# Patient Record
Sex: Male | Born: 1950 | Race: Asian | Hispanic: No | Marital: Married | Smoking: Never smoker
Health system: Southern US, Community
[De-identification: ages and names within clinical notes are randomized; demographics above are authoritative.]

## PROBLEM LIST (undated history)

## (undated) DIAGNOSIS — I10 Essential (primary) hypertension: Secondary | ICD-10-CM

## (undated) DIAGNOSIS — R519 Headache, unspecified: Secondary | ICD-10-CM

## (undated) DIAGNOSIS — E785 Hyperlipidemia, unspecified: Secondary | ICD-10-CM

## (undated) DIAGNOSIS — I2510 Atherosclerotic heart disease of native coronary artery without angina pectoris: Secondary | ICD-10-CM

## (undated) DIAGNOSIS — R7303 Prediabetes: Secondary | ICD-10-CM

## (undated) DIAGNOSIS — R51 Headache: Secondary | ICD-10-CM

## (undated) DIAGNOSIS — K219 Gastro-esophageal reflux disease without esophagitis: Secondary | ICD-10-CM

## (undated) HISTORY — DX: Prediabetes: R73.03

## (undated) HISTORY — DX: Atherosclerotic heart disease of native coronary artery without angina pectoris: I25.10

## (undated) HISTORY — DX: Essential (primary) hypertension: I10

## (undated) HISTORY — DX: Headache: R51

## (undated) HISTORY — DX: Gastro-esophageal reflux disease without esophagitis: K21.9

## (undated) HISTORY — DX: Headache, unspecified: R51.9

## (undated) HISTORY — DX: Hyperlipidemia, unspecified: E78.5

---

## 2001-01-30 ENCOUNTER — Encounter: Payer: Self-pay | Admitting: Family Medicine

## 2001-01-30 ENCOUNTER — Encounter: Admission: RE | Admit: 2001-01-30 | Discharge: 2001-01-30 | Payer: Self-pay | Admitting: Family Medicine

## 2003-01-26 ENCOUNTER — Encounter: Payer: Self-pay | Admitting: Family Medicine

## 2003-01-26 ENCOUNTER — Encounter: Admission: RE | Admit: 2003-01-26 | Discharge: 2003-01-26 | Payer: Self-pay | Admitting: Family Medicine

## 2003-07-15 ENCOUNTER — Encounter: Admission: RE | Admit: 2003-07-15 | Discharge: 2003-07-15 | Payer: Self-pay | Admitting: Family Medicine

## 2003-07-15 ENCOUNTER — Encounter: Payer: Self-pay | Admitting: Family Medicine

## 2003-08-06 ENCOUNTER — Encounter: Payer: Self-pay | Admitting: Family Medicine

## 2003-08-06 ENCOUNTER — Encounter: Admission: RE | Admit: 2003-08-06 | Discharge: 2003-08-06 | Payer: Self-pay | Admitting: Family Medicine

## 2003-12-09 ENCOUNTER — Encounter: Admission: RE | Admit: 2003-12-09 | Discharge: 2003-12-09 | Payer: Self-pay | Admitting: Family Medicine

## 2005-09-22 ENCOUNTER — Encounter: Admission: RE | Admit: 2005-09-22 | Discharge: 2005-09-22 | Payer: Self-pay | Admitting: Family Medicine

## 2006-07-02 ENCOUNTER — Encounter: Admission: RE | Admit: 2006-07-02 | Discharge: 2006-07-02 | Payer: Self-pay | Admitting: Family Medicine

## 2008-12-21 ENCOUNTER — Encounter: Admission: RE | Admit: 2008-12-21 | Discharge: 2008-12-21 | Payer: Self-pay | Admitting: Family Medicine

## 2009-01-01 ENCOUNTER — Encounter: Admission: RE | Admit: 2009-01-01 | Discharge: 2009-01-01 | Payer: Self-pay | Admitting: Family Medicine

## 2010-07-08 ENCOUNTER — Encounter: Admission: RE | Admit: 2010-07-08 | Discharge: 2010-07-08 | Payer: Self-pay | Admitting: Family Medicine

## 2014-09-23 ENCOUNTER — Institutional Professional Consult (permissible substitution): Payer: Self-pay | Admitting: Interventional Cardiology

## 2014-10-01 ENCOUNTER — Encounter: Payer: Self-pay | Admitting: Interventional Cardiology

## 2014-10-28 ENCOUNTER — Other Ambulatory Visit: Payer: Self-pay | Admitting: Gastroenterology

## 2014-10-28 ENCOUNTER — Encounter: Payer: Self-pay | Admitting: Cardiology

## 2014-10-28 DIAGNOSIS — R110 Nausea: Secondary | ICD-10-CM

## 2014-10-28 DIAGNOSIS — R1011 Right upper quadrant pain: Secondary | ICD-10-CM

## 2014-11-03 ENCOUNTER — Encounter (HOSPITAL_COMMUNITY)
Admission: RE | Admit: 2014-11-03 | Discharge: 2014-11-03 | Disposition: A | Discharge status: Home / Self Care | Payer: BLUE CROSS/BLUE SHIELD | Source: Ambulatory Visit | Attending: Gastroenterology | Admitting: Gastroenterology

## 2014-11-03 ENCOUNTER — Ambulatory Visit (HOSPITAL_COMMUNITY)
Admission: RE | Admit: 2014-11-03 | Discharge: 2014-11-03 | Disposition: A | Discharge status: Home / Self Care | Payer: BLUE CROSS/BLUE SHIELD | Source: Ambulatory Visit | Attending: Gastroenterology | Admitting: Gastroenterology

## 2014-11-03 DIAGNOSIS — R109 Unspecified abdominal pain: Secondary | ICD-10-CM | POA: Insufficient documentation

## 2014-11-03 DIAGNOSIS — R1011 Right upper quadrant pain: Secondary | ICD-10-CM

## 2014-11-03 DIAGNOSIS — R110 Nausea: Secondary | ICD-10-CM

## 2014-11-03 MED ORDER — TECHNETIUM TC 99M MEBROFENIN IV KIT
5.5000 | PACK | Freq: Once | INTRAVENOUS | Status: AC | PRN
  Administered 2014-11-03: 6 via INTRAVENOUS

## 2014-11-03 MED ORDER — SINCALIDE 5 MCG IJ SOLR
0.0200 ug/kg | Freq: Once | INTRAMUSCULAR | Status: AC
  Administered 2014-11-03: 1.3 ug via INTRAVENOUS

## 2015-01-13 ENCOUNTER — Other Ambulatory Visit: Payer: Self-pay | Admitting: Family Medicine

## 2015-01-13 ENCOUNTER — Ambulatory Visit
Admission: RE | Admit: 2015-01-13 | Discharge: 2015-01-13 | Disposition: A | Discharge status: Home / Self Care | Payer: Worker's Compensation | Source: Ambulatory Visit | Attending: Family Medicine | Admitting: Family Medicine

## 2015-01-13 DIAGNOSIS — W19XXXA Unspecified fall, initial encounter: Secondary | ICD-10-CM

## 2015-03-19 ENCOUNTER — Other Ambulatory Visit: Payer: Self-pay | Admitting: Family Medicine

## 2015-03-19 DIAGNOSIS — R51 Headache: Principal | ICD-10-CM

## 2015-03-19 DIAGNOSIS — R519 Headache, unspecified: Secondary | ICD-10-CM

## 2015-03-24 ENCOUNTER — Ambulatory Visit
Admission: RE | Admit: 2015-03-24 | Discharge: 2015-03-24 | Disposition: A | Discharge status: Home / Self Care | Payer: BLUE CROSS/BLUE SHIELD | Source: Ambulatory Visit | Attending: Family Medicine | Admitting: Family Medicine

## 2015-03-24 DIAGNOSIS — R519 Headache, unspecified: Secondary | ICD-10-CM

## 2015-03-24 DIAGNOSIS — R51 Headache: Principal | ICD-10-CM

## 2015-03-25 ENCOUNTER — Ambulatory Visit: Admission: RE | Admit: 2015-03-25 | Payer: BLUE CROSS/BLUE SHIELD | Source: Ambulatory Visit

## 2015-03-25 ENCOUNTER — Other Ambulatory Visit: Payer: Self-pay | Admitting: Family Medicine

## 2015-03-25 DIAGNOSIS — R51 Headache: Principal | ICD-10-CM

## 2015-03-25 DIAGNOSIS — R519 Headache, unspecified: Secondary | ICD-10-CM

## 2015-03-29 ENCOUNTER — Other Ambulatory Visit: Payer: Self-pay

## 2015-04-10 ENCOUNTER — Ambulatory Visit
Admission: RE | Admit: 2015-04-10 | Discharge: 2015-04-10 | Disposition: A | Discharge status: Home / Self Care | Payer: BLUE CROSS/BLUE SHIELD | Source: Ambulatory Visit | Attending: Family Medicine | Admitting: Family Medicine

## 2015-04-10 ENCOUNTER — Other Ambulatory Visit: Payer: Self-pay

## 2015-04-10 DIAGNOSIS — R519 Headache, unspecified: Secondary | ICD-10-CM

## 2015-04-10 DIAGNOSIS — R51 Headache: Principal | ICD-10-CM

## 2015-04-10 MED ORDER — GADOBENATE DIMEGLUMINE 529 MG/ML IV SOLN
13.0000 mL | Freq: Once | INTRAVENOUS | Status: AC | PRN
  Administered 2015-04-10: 13 mL via INTRAVENOUS

## 2015-06-04 ENCOUNTER — Other Ambulatory Visit: Payer: Self-pay | Admitting: Allergy

## 2015-06-04 ENCOUNTER — Ambulatory Visit
Admission: RE | Admit: 2015-06-04 | Discharge: 2015-06-04 | Disposition: A | Discharge status: Home / Self Care | Payer: BLUE CROSS/BLUE SHIELD | Source: Ambulatory Visit | Attending: Allergy | Admitting: Allergy

## 2015-06-04 DIAGNOSIS — J4520 Mild intermittent asthma, uncomplicated: Secondary | ICD-10-CM

## 2015-07-23 ENCOUNTER — Encounter: Payer: Self-pay | Admitting: *Deleted

## 2015-07-26 ENCOUNTER — Ambulatory Visit (INDEPENDENT_AMBULATORY_CARE_PROVIDER_SITE_OTHER): Payer: BLUE CROSS/BLUE SHIELD | Admitting: Diagnostic Neuroimaging

## 2015-07-26 ENCOUNTER — Encounter: Payer: Self-pay | Admitting: Diagnostic Neuroimaging

## 2015-07-26 ENCOUNTER — Encounter: Payer: Self-pay | Admitting: *Deleted

## 2015-07-26 VITALS — BP 154/90 | HR 78 | Ht 64.0 in | Wt 138.4 lb

## 2015-07-26 DIAGNOSIS — G43009 Migraine without aura, not intractable, without status migrainosus: Secondary | ICD-10-CM | POA: Diagnosis not present

## 2015-07-26 DIAGNOSIS — G4700 Insomnia, unspecified: Secondary | ICD-10-CM

## 2015-07-26 DIAGNOSIS — G4719 Other hypersomnia: Secondary | ICD-10-CM | POA: Diagnosis not present

## 2015-07-26 DIAGNOSIS — R0683 Snoring: Secondary | ICD-10-CM | POA: Diagnosis not present

## 2015-07-26 DIAGNOSIS — R42 Dizziness and giddiness: Secondary | ICD-10-CM

## 2015-07-26 MED ORDER — TOPIRAMATE 25 MG PO TABS: 25.0000 mg | ORAL_TABLET | Freq: Two times a day (BID) | ORAL | Status: DC

## 2015-07-26 NOTE — Patient Instructions (Addendum)
Thank you for coming to see [place] at [facility]. I hope we have been able to provide you high quality care today.  You may receive a patient satisfaction survey over the next few weeks. We would appreciate your feedback and comments so that we may continue to improve ourselves and the health of our patients.  - I will check MRI brain  - I will setup sleep study referral - try topiramate 57m at bedtime; after 1-2 weeks, may increase to 586mat bedtime; after 1-2 weeks may increase to 5064mwice a day - monitor blood pressure at home (record morning, resting BP)  - will recommend to be excused from work through September 07, 2015 due to this dizziness and headache   ~~~~~~~~~~~~~~~~~~~~~~~~~~~~~~~~~~~~~~~~~~~~~~~~~~~~~~~~~~~~~~~~~  DR. PENUMALLI'S GUIDE TO HAPPY AND HEALTHY LIVING These are some of my general health and wellness recommendations. Some of them may apply to you better than others. Please use common sense as you try these suggestions and feel free to ask me any questions.   ACTIVITY/FITNESS Mental, social, emotional and physical stimulation are very important for brain and body health. Try learning a new activity (arts, music, language, sports, games).  Keep moving your body to the best of your abilities. You can do this at home, inside or outside, the park, community center, gym or anywhere you like. Consider a physical therapist or personal trainer to get started. Consider the app Sworkit. Fitness trackers such as smart-watches, smart-phones or Fitbits can help as well.   NUTRITION Eat more plants: colorful vegetables, nuts, seeds and berries.  Eat less sugar, salt, preservatives and processed foods.  Avoid toxins such as cigarettes and alcohol.  Drink water when you are thirsty. Warm water with a slice of lemon is an excellent morning drink to start the day.  Consider these websites for more information The Nutrition Source  (htthttps://www.henry-hernandez.biz/recision Nutrition (wwwWindowBlog.ch RELAXATION Consider practicing mindfulness meditation or other relaxation techniques such as deep breathing, prayer, yoga, tai chi, massage. See website mindful.org or the apps Headspace or Calm to help get started.   SLEEP Try to get at least 7-8+ hours sleep per day. Regular exercise and reduced caffeine will help you sleep better. Practice good sleep hygeine techniques. See website sleep.org for more information.   PLANNING Prepare estate planning, living will, healthcare POA documents. Sometimes this is best planned with the help of an attorney. Theconversationproject.org and agingwithdignity.org are excellent resources.

## 2015-07-26 NOTE — Progress Notes (Signed)
[facility]  PATIENT: Aaron David DOB: 08-20-1951  REFERRING CLINICIAN: Maxwell HISTORY FROM: patient and wife  REASON FOR VISIT: new consult    HISTORICAL  CHIEF COMPLAINT:  Chief Complaint  Patient presents with  . Migraine    rm 7 New Patient, wife- [name]  . Vertigo    HISTORY OF PRESENT ILLNESS:   64 year old right-handed male with borderline diabetes, hypertension, here for evaluation of headaches and dizziness.  In March 2016 patient fell down when he didn't realize that a chair had been moved out of the way, fell down onto his bottom and developed low back pain. No head pain or dizziness at that time.  In June 2016 patient developed onset of generalized headache. Patient had MRI/MRA of the head which showed no acute findings.  In July 2016 patient had onset of right-sided ringing in the ear, fullness in the right ear, increasing headaches, drums sound in the right ear. This was associated with nausea and vomiting, phonophobia. No photophobia.  Since that time symptoms have slightly eased up but not gone away. He continues to have daily symptoms which fluctuate. No specific triggering factors. He went to see ENT who performed ENG testing raising possibility of CNS dysfunction. No peripheral vestibulopathy was found.  Patient denies history of migraine headaches or significant headaches earlier in life. No family history of migraines. His aunt had some headaches but these were not specifically diagnosed.  Patient does endorse some problems with snoring, insomnia, anxiety symptoms especially since the past 3 months. Also with significant daytime fatigue.   REVIEW OF SYSTEMS: Full 14 system review of systems performed and notable only for fatigue ringing in ears spinning sensation allergies headache insomnia snoring weakness dizziness.   ALLERGIES: Allergies  Allergen Reactions  . Penicillins Rash    itching    HOME MEDICATIONS: No  outpatient prescriptions prior to visit.   No facility-administered medications prior to visit.    PAST MEDICAL HISTORY: Past Medical History  Diagnosis Date  . Acid reflux   . Borderline diabetes   . Headache     PAST SURGICAL HISTORY: History reviewed. No pertinent past surgical history.  FAMILY HISTORY: Family History  Problem Relation Age of Onset  . Diabetes Mother   . Heart disease Mother   . Asthma Father     SOCIAL HISTORY:  Social History   Social History  . Marital Status: Married    Spouse Name: [name]  . Number of Children: 2  . Years of Education: 16   Occupational History  .      [employer]   Social History Main Topics  . Smoking status: Never Smoker   . Smokeless tobacco: Not on file  . Alcohol Use: No  . Drug Use: No  . Sexual Activity: Not on file   Other Topics Concern  . Not on file   Social History Narrative   Lives at home with wife, [name]   Caffeine use- coffee, tea 1 cup daily     PHYSICAL EXAM  GENERAL EXAM/CONSTITUTIONAL: Vitals:  Filed Vitals:   07/26/15 0828 07/26/15 0859 07/26/15 0900  BP: 140/85 140/84 154/90  Pulse: 80 76 78  Height: 5\' 4"  (1.626 m)    Weight: 138 lb 6.4 oz (62.778 kg)       Body mass index is 23.74 kg/(m^2).  Visual Acuity Screening   Right eye Left eye Both eyes  Without correction:     With correction: 20/30 20/30  Patient is in no distress; well developed, nourished and groomed; neck is supple  CARDIOVASCULAR:  Examination of carotid arteries is normal; no carotid bruits  Regular rate and rhythm, no murmurs  Examination of peripheral vascular system by observation and palpation is normal  EYES:  Ophthalmoscopic exam of optic discs and posterior segments is normal; no papilledema or hemorrhages  MUSCULOSKELETAL:  Gait, strength, tone, movements noted in Neurologic exam below  NEUROLOGIC: MENTAL STATUS:  No flowsheet data found.  awake, alert, oriented to person,  place and time  recent and remote memory intact  normal attention and concentration  language fluent, comprehension intact, naming intact,   fund of knowledge appropriate  CRANIAL NERVE:   2nd - no papilledema on fundoscopic exam  2nd, 3rd, 4th, 6th - pupils equal and reactive to light, visual fields full to confrontation, extraocular muscles intact, no nystagmus  5th - facial sensation symmetric  7th - facial strength symmetric  8th - hearing intact  9th - palate elevates symmetrically, uvula midline  11th - shoulder shrug symmetric  12th - tongue protrusion midline  MOTOR:   normal bulk and tone, full strength in the BUE, BLE  SENSORY:   normal and symmetric to light touch, pinprick, temperature, vibration  COORDINATION:   finger-nose-finger, fine finger movements normal  REFLEXES:   deep tendon reflexes TRACE and symmetric  GAIT/STATION:   narrow based gait; able to walk tandem; romberg is negative    DIAGNOSTIC DATA (LABS, IMAGING, TESTING) - I reviewed patient records, labs, notes, testing and imaging myself where available.  No results found for: WBC, HGB, HCT, MCV, PLT No results found for: NA, K, CL, CO2, GLUCOSE, BUN, CREATININE, CALCIUM, PROT, ALBUMIN, AST, ALT, ALKPHOS, BILITOT, GFRNONAA, GFRAA No results found for: CHOL, HDL, LDLCALC, LDLDIRECT, TRIG, CHOLHDL No results found for: HGBA1C No results found for: VITAMINB12 No results found for: TSH  04/10/15 MRI brain [I reviewed images myself and agree with interpretation. -VRP]  - No acute intracranial abnormality. Mild chronic white matter changes likely related to microvascular ischemia - Chronic microhemorrhage right cerebellum, question hypertension  04/10/15 MRA head [I reviewed images myself and agree with interpretation. -VRP]  - Negative    ASSESSMENT AND PLAN  64 y.o. year old male here with new onset headaches in June 2016, now with intermittent vertigo, dizziness, nausea,  right-sided throbbing headaches since July 2016. Patient had MRI study in June 2016 which showed no acute findings, but this was before onset of vertigo and nausea symptoms in July 2016. Therefore will repeat MRI scan to make sure no additional acute insult has occurred.   Migraine phenomenon is a possibility, but less likely given the fact the patient has never had migraine symptoms earlier in life and would be unusual for patient to start with migraine symptoms age 53 years old. Also will set up sleep study to rule out sleep apnea. We'll try empiric trial of topiramate.  Ddx: stroke vs migraine phenomenon  Dizziness and giddiness - Plan: MR Brain/IAC Wo/W Cm, Ambulatory referral to Sleep Studies  Migraine without aura and without status migrainosus, not intractable - Plan: MR Brain/IAC Wo/W Cm, Ambulatory referral to Sleep Studies  Snoring - Plan: MR Brain/IAC Wo/W Cm, Ambulatory referral to Sleep Studies  Excessive daytime sleepiness - Plan: MR Brain/IAC Wo/W Cm, Ambulatory referral to Sleep Studies  Insomnia - Plan: MR Brain/IAC Wo/W Cm, Ambulatory referral to Sleep Studies    PLAN: - I will check MRI brain  - I will setup  sleep study referral - try topiramate 25mg  at bedtime; after 1-2 weeks, may increase to 50mg  at bedtime; after 1-2 weeks may increase to 50mg  twice a day - monitor blood pressure at home (record morning, resting BP)  - will recommend to be excused from work through September 07, 2015 due to this dizziness and headache  Orders Placed This Encounter  Procedures  . MR Brain/IAC Wo/W Cm  . Ambulatory referral to Sleep Studies    Meds ordered this encounter  Medications  . topiramate (TOPAMAX) 25 MG tablet    Sig: Take 1-2 tablets (25-50 mg total) by mouth 2 (two) times daily.    Dispense:  120 tablet    Refill:  3   Return in about 6 weeks (around 09/06/2015).    Penni Bombard, MD 82/0/8138, 8:71 AM Certified in Neurology, Neurophysiology and  Neuroimaging  [facility] [address] [place], [place] [address] [phone]

## 2015-08-02 ENCOUNTER — Encounter: Payer: Self-pay | Admitting: Neurology

## 2015-08-02 ENCOUNTER — Telehealth: Payer: Self-pay | Admitting: *Deleted

## 2015-08-02 ENCOUNTER — Encounter: Payer: Self-pay | Admitting: *Deleted

## 2015-08-02 ENCOUNTER — Ambulatory Visit (INDEPENDENT_AMBULATORY_CARE_PROVIDER_SITE_OTHER): Payer: BLUE CROSS/BLUE SHIELD | Admitting: Neurology

## 2015-08-02 VITALS — BP 124/72 | HR 78 | Resp 16 | Ht 64.0 in | Wt 137.0 lb

## 2015-08-02 DIAGNOSIS — R42 Dizziness and giddiness: Secondary | ICD-10-CM

## 2015-08-02 DIAGNOSIS — R351 Nocturia: Secondary | ICD-10-CM | POA: Diagnosis not present

## 2015-08-02 DIAGNOSIS — R51 Headache: Secondary | ICD-10-CM | POA: Diagnosis not present

## 2015-08-02 DIAGNOSIS — R519 Headache, unspecified: Secondary | ICD-10-CM

## 2015-08-02 DIAGNOSIS — R0683 Snoring: Secondary | ICD-10-CM

## 2015-08-02 DIAGNOSIS — G4719 Other hypersomnia: Secondary | ICD-10-CM | POA: Diagnosis not present

## 2015-08-02 NOTE — Progress Notes (Signed)
Patient in office requesting medication to take prior to MRI in Thurs. Per Dr Leta Baptist, patient given Alprazolam 0.5 mg tabs, 3 tabs with instructions on envelope.  Patient verbalized understanding of instructions.

## 2015-08-02 NOTE — Patient Instructions (Signed)

## 2015-08-02 NOTE — Progress Notes (Signed)
Subjective:    Patient ID: Aaron David is a 64 y.o. male.  HPI     Star Age, MD, PhD [facility] [address] P.O. [place], [place] [address]  Dear Aaron David,   I saw your patient, Aaron David, upon your kind request in my clinic today for initial consultation of his sleep disorder, in particular, concern for underlying obstructive sleep apnea. The patient is unaccompanied by his wife today. As you know, Mr. Aaron David is a very pleasant 64 year old [place] gentleman with an underlying medical history of hypertension, borderline diabetes, acid reflux disease and recurrent headaches who you have seen for headaches and dizziness. I reviewed your office note from 07/26/2015. The patient reports snoring, nocturia, EDS and difficulty with sleep maintenance, and rare morning headaches. His bedtime is around 10 to 10:30 PM. Rise time is 6 AM. He is currently not working and on medical leave. He drinks 1 cup of tea and usually 1 cup of coffee during the day. He does not watch TV at night in his bedroom. He does not drink alcohol and does not smoke. He has to get up to use to bathroom about 2-3 times on an average night. He reports rare morning headaches about once per month. He snores but typically mildly. He is not aware of any breathing pauses while asleep and his wife has not noticed any pauses in his breathing while he is asleep. He denies any significant restless leg symptoms or twitching of his legs at night. He is scheduled for a brain MRI next week. He still reports dizziness which is intermittent. As I understand, he was checked out by ENT as well. Sometimes he has lightheadedness when standing. He says he drinks enough water every day. He is sleepy during the day. His Epworth sleepiness score is 18 out of 24 today, his fatigue score is 46 out of 63.  His Past Medical History Is Significant For: Past Medical History  Diagnosis Date  . Acid reflux   .  Borderline diabetes   . Headache     His Past Surgical History Is Significant For: No past surgical history on file.  His Family History Is Significant For: Family History  Problem Relation Age of Onset  . Diabetes Mother   . Heart disease Mother   . Asthma Father     His Social History Is Significant For: Social History   Social History  . Marital Status: Married    Spouse Name: Aaron David  . Number of Children: 2  . Years of Education: 16   Occupational History  .      [employer]   Social History Main Topics  . Smoking status: Never Smoker   . Smokeless tobacco: None  . Alcohol Use: No  . Drug Use: No  . Sexual Activity: Not Asked   Other Topics Concern  . None   Social History Narrative   Lives at home with wife, Aaron David   Caffeine use- coffee, tea 1 cup daily    His Allergies Are:  Allergies  Allergen Reactions  . Penicillins Rash    itching  :   His Current Medications Are:  Outpatient Encounter Prescriptions as of 08/02/2015  Medication Sig  . albuterol (PROVENTIL HFA;VENTOLIN HFA) 108 (90 BASE) MCG/ACT inhaler Inhale 2 puffs into the lungs every 6 (six) hours as needed for wheezing or shortness of breath.  Marland Kitchen aspirin 81 MG tablet Take 81 mg by mouth daily.  Marland Kitchen atorvastatin (LIPITOR)  40 MG tablet   . azelastine (OPTIVAR) 0.05 % ophthalmic solution   . Azelastine-Fluticasone (DYMISTA) 137-50 MCG/ACT SUSP Place into the nose 2 (two) times daily before a meal.  . calcium carbonate 1250 MG capsule Take 1,250 mg by mouth 2 (two) times daily with a meal.  . carvedilol (COREG) 6.25 MG tablet   . HYDROcodone-acetaminophen (NORCO/VICODIN) 5-325 MG tablet Take 1 tablet by mouth every 6 (six) hours as needed for moderate pain.  Marland Kitchen levocetirizine (XYZAL) 5 MG tablet Take 5 mg by mouth every evening.  Marland Kitchen lisinopril (PRINIVIL,ZESTRIL) 2.5 MG tablet   . metFORMIN (GLUCOPHAGE-XR) 500 MG 24 hr tablet   . montelukast (SINGULAIR) 10 MG tablet   . pantoprazole (PROTONIX)  40 MG tablet   . topiramate (TOPAMAX) 25 MG tablet Take 1-2 tablets (25-50 mg total) by mouth 2 (two) times daily.  . [DISCONTINUED] carvedilol (COREG) 12.5 MG tablet Take 12.5 mg by mouth 2 (two) times daily with a meal.  . [DISCONTINUED] lisinopril (PRINIVIL,ZESTRIL) 10 MG tablet Take 10 mg by mouth daily.  . [DISCONTINUED] pantoprazole (PROTONIX) 20 MG tablet Take 20 mg by mouth daily.   No facility-administered encounter medications on file as of 08/02/2015.  :  Review of Systems:  Out of a complete 14 point review of systems, all are reviewed and negative with the exception of these symptoms as listed below:   Review of Systems  HENT: Positive for tinnitus.   Allergic/Immunologic: Positive for environmental allergies.  Neurological: Positive for dizziness, weakness and headaches.       Patient reports that when he sleeps, it is very light sleep. Falls asleep watching TV. Snoring reported, sometimes wakes up in the morning feeling tired, morning headaches, daytime tiredness, falls asleep during day.     Objective:  Neurologic Exam  Physical Exam Physical Examination:   Filed Vitals:   08/02/15 1131  BP: 124/72  Pulse: 78  Resp: 16   General Examination: The patient is a very pleasant 64 y.o. male in no acute distress. He appears well-developed and well-nourished and well groomed.   HEENT: Normocephalic, atraumatic, pupils are equal, round and reactive to light and accommodation. Funduscopic exam is normal with sharp disc margins noted. Extraocular tracking is good without limitation to gaze excursion or nystagmus noted. Normal smooth pursuit is noted. Hearing is grossly intact. Tympanic membranes are clear bilaterally. Face is symmetric with normal facial animation and normal facial sensation. Speech is clear with no dysarthria noted. There is no hypophonia. There is no lip, neck/head, jaw or voice tremor. Neck is supple with full range of passive and active motion. There are no  carotid bruits on auscultation. Oropharynx exam reveals: mild mouth dryness, adequate dental hygiene and mild airway crowding, due to narrow airway entry and redundant soft palate. Tonsils are small b/l. Mallampati is class II. Tongue protrudes centrally and palate elevates symmetrically. Neck size is 13.75 inches. He has a Mild overbite. Nasal inspection reveals no significant nasal mucosal bogginess or redness and no septal deviation.   Chest: Clear to auscultation without wheezing, rhonchi or crackles noted.  Heart: S1+S2+0, regular and normal without murmurs, rubs or gallops noted.   Abdomen: Soft, non-tender and non-distended with normal bowel sounds appreciated on auscultation.  Extremities: There is no pitting edema in the distal lower extremities bilaterally. Pedal pulses are intact.  Skin: Warm and dry without trophic changes noted. There are no varicose veins.  Musculoskeletal: exam reveals no obvious joint deformities, tenderness or joint swelling or erythema.  Neurologically:  Mental status: The patient is awake, alert and oriented in all 4 spheres. His immediate and remote memory, attention, language skills and fund of knowledge are appropriate. There is no evidence of aphasia, agnosia, apraxia or anomia. Speech is clear with normal prosody and enunciation. Thought process is linear. Mood is normal and affect is normal.  Cranial nerves II - XII are as described above under HEENT exam. In addition: shoulder shrug is normal with equal shoulder height noted. Motor exam: Normal bulk, strength and tone is noted. There is no drift, tremor or rebound. Romberg is negative. Reflexes are 2+ throughout. Babinski: Toes are flexor bilaterally. Fine motor skills and coordination: intact with normal finger taps, normal hand movements, normal rapid alternating patting, normal foot taps and normal foot agility.  Cerebellar testing: No dysmetria or intention tremor on finger to nose testing. Heel to  shin is unremarkable bilaterally. There is no truncal or gait ataxia.  Sensory exam: intact to light touch, pinprick, vibration, temperature sense in the upper and lower extremities.  Gait, station and balance: He stands easily and reports mild lightheadedness, brief spinning sensation. No veering to one side is noted. No leaning to one side is noted. Posture is age-appropriate and stance is narrow based. Gait shows normal stride length and normal pace. No problems turning are noted. He turns en bloc. Tandem walk is somewhat challenging for him. He does it slowly.   Assessment and Plan:   In summary, KORBAN SHEARER is a very pleasant 64 y.o.-year old male with an underlying medical history of hypertension, borderline diabetes, acid reflux disease and recurrent headaches who reports snoring, excessive daytime somnolence, nocturia or morning headaches. His history and physical exam are indeed concerning for obstructive sleep apnea (OSA). I had a long chat with the patient and his wife about my findings and the diagnosis of OSA, its prognosis and treatment options. We talked about medical treatments, surgical interventions and non-pharmacological approaches. I explained in particular the risks and ramifications of untreated moderate to severe OSA, especially with respect to developing cardiovascular disease down the Road, including congestive heart failure, difficult to treat hypertension, cardiac arrhythmias, or stroke. Even type 2 diabetes has, in part, been linked to untreated OSA. Symptoms of untreated OSA include daytime sleepiness, memory problems, mood irritability and mood disorder such as depression and anxiety, lack of energy, as well as recurrent headaches, especially morning headaches. We talked about trying to maintain a healthy lifestyle in general, as well as the importance of weight control. I encouraged the patient to eat healthy, exercise daily and keep well hydrated, to keep a scheduled  bedtime and wake time routine, to not skip any meals and eat healthy snacks in between meals. I advised the patient not to drive when feeling sleepy. I recommended the following at this time: sleep study with potential positive airway pressure titration. (We will score hypopneas at 3% and split the sleep study into diagnostic and treatment portion, if the estimated. 2 hour AHI is >15/h).   I explained the sleep test procedure to the patient and also outlined possible surgical and non-surgical treatment options of OSA, including the use of a custom-made dental device (which would require a referral to a specialist dentist or oral surgeon), upper airway surgical options, such as pillar implants, radiofrequency surgery, tongue base surgery, and UPPP (which would involve a referral to an ENT surgeon). Rarely, jaw surgery such as mandibular advancement may be considered.  I also explained the CPAP treatment option  to the patient, who indicated that he would be willing to try CPAP if the need arises. I explained the importance of being compliant with PAP treatment, not only for insurance purposes but primarily to improve His symptoms, and for the patient's long term health benefit, including to reduce His cardiovascular risks. I answered all their questions today and the patient and his wife were in agreement. I would like to see him back after the sleep study is completed and encouraged him to call with any interim questions, concerns, problems or updates.   Thank you very much for allowing me to participate in the care of this nice patient. If I can be of any further assistance to you please do not hesitate to talk to me.   Sincerely,   Star Age, MD, PhD

## 2015-08-02 NOTE — Telephone Encounter (Signed)
Patient form on Leighton Parody desk.

## 2015-08-03 ENCOUNTER — Telehealth: Payer: Self-pay | Admitting: *Deleted

## 2015-08-03 DIAGNOSIS — Z0289 Encounter for other administrative examinations: Secondary | ICD-10-CM

## 2015-08-03 NOTE — Telephone Encounter (Signed)
Form,[employer] received,completed by Dr Leta Baptist and Milagros Evener copy to patient 08/03/15.

## 2015-08-10 ENCOUNTER — Telehealth: Payer: Self-pay | Admitting: *Deleted

## 2015-08-10 DIAGNOSIS — Z0289 Encounter for other administrative examinations: Secondary | ICD-10-CM

## 2015-08-10 NOTE — Telephone Encounter (Signed)
Form,[employer] sent to [facility] C and Dr Leta Baptist 08/10/15.

## 2015-08-12 ENCOUNTER — Ambulatory Visit
Admission: RE | Admit: 2015-08-12 | Discharge: 2015-08-12 | Disposition: A | Discharge status: Home / Self Care | Payer: BLUE CROSS/BLUE SHIELD | Source: Ambulatory Visit | Attending: Diagnostic Neuroimaging | Admitting: Diagnostic Neuroimaging

## 2015-08-12 DIAGNOSIS — R42 Dizziness and giddiness: Secondary | ICD-10-CM | POA: Diagnosis not present

## 2015-08-12 DIAGNOSIS — G4700 Insomnia, unspecified: Secondary | ICD-10-CM

## 2015-08-12 DIAGNOSIS — G4719 Other hypersomnia: Secondary | ICD-10-CM

## 2015-08-12 DIAGNOSIS — G43009 Migraine without aura, not intractable, without status migrainosus: Secondary | ICD-10-CM

## 2015-08-12 DIAGNOSIS — R0683 Snoring: Secondary | ICD-10-CM

## 2015-08-12 MED ORDER — GADOBENATE DIMEGLUMINE 529 MG/ML IV SOLN
13.0000 mL | Freq: Once | INTRAVENOUS | Status: AC | PRN
  Administered 2015-08-12: 13 mL via INTRAVENOUS

## 2015-08-12 NOTE — Telephone Encounter (Signed)
Left VM for patient requesting call back for clarification on [employer] papers. Left this caller's name, number, best time to call.

## 2015-08-12 NOTE — Telephone Encounter (Signed)
Patient called back and requested that MRI ( which he had today) results be included in completing his [employer] papers. He stated not to wait on sleep study results. Informed him Dr Leta Baptist returns to office Mon, will get papers completed and have him sign then. He verbalized understanding, appreciation.

## 2015-08-13 ENCOUNTER — Telehealth: Payer: Self-pay | Admitting: Diagnostic Neuroimaging

## 2015-08-13 NOTE — Telephone Encounter (Signed)
Pt called back and requested to send sleep study to [facility] also.

## 2015-08-17 ENCOUNTER — Ambulatory Visit (INDEPENDENT_AMBULATORY_CARE_PROVIDER_SITE_OTHER): Payer: BLUE CROSS/BLUE SHIELD | Admitting: Neurology

## 2015-08-17 ENCOUNTER — Telehealth: Payer: Self-pay | Admitting: *Deleted

## 2015-08-17 DIAGNOSIS — G4710 Hypersomnia, unspecified: Secondary | ICD-10-CM | POA: Diagnosis not present

## 2015-08-17 DIAGNOSIS — G4720 Circadian rhythm sleep disorder, unspecified type: Secondary | ICD-10-CM

## 2015-08-17 DIAGNOSIS — G479 Sleep disorder, unspecified: Secondary | ICD-10-CM

## 2015-08-17 DIAGNOSIS — G4761 Periodic limb movement disorder: Secondary | ICD-10-CM

## 2015-08-17 NOTE — Telephone Encounter (Addendum)
Spoke with patient and informed him, per Dr Leta Baptist his MRI brain results are normal for his age. He requests Dr Leta Baptist be informed that he continues to "feel right inner ear pressure and has dizziness". Informed him that per MRI report, his inner auditory canals and contents appear normal. He inquired if "Dr Leta Baptist has any other tests he recommends". Informed him that Dr Leta Baptist will wait for sleep study results and then make decision re: other tests. He verbalized understanding.  He has sleep study tonight and has requested results be included when completing [place] papers. Will route to Dr Leta Baptist as patient requested.  Spoke with patient and informed him he needs to sign release of information before he can obtain copy of his MRI results. Gave him office hours of operation. He verbalized understanding.

## 2015-08-18 NOTE — Sleep Study (Signed)
Please see the scanned sleep study interpretation located in the Procedure tab within the Chart Review section. 

## 2015-08-23 ENCOUNTER — Telehealth: Payer: Self-pay | Admitting: Neurology

## 2015-08-23 NOTE — Telephone Encounter (Signed)
Patient referred by Dr. Leta Baptist, seen by me on 08/02/15, diagnostic PSG on 08/17/15.   Please call and notify the patient that the recent sleep study did not show any significant obstructive sleep apnea. He had leg kicking/twitching in his sleep, which may cause some sleep disruption and leg twitching is associated with RLS. He can FU with Dr. Leta Baptist as planned. Also, route or fax report to PCP and referring MD, if other than PCP.  Once you have spoken to patient, you can close this encounter.   Thanks,  Star Age, MD, PhD [facility])

## 2015-08-24 ENCOUNTER — Telehealth: Payer: Self-pay | Admitting: *Deleted

## 2015-08-24 NOTE — Telephone Encounter (Signed)
Patient was here at office. I spoke to him and gave him results of sleep study. He will f/u with Dr. Leta Baptist. I will fax report to PCP.

## 2015-08-24 NOTE — Telephone Encounter (Signed)
Information systems [employer] sent to [place] [facility] and Dr Leta Baptist 08/24/15.

## 2015-08-26 NOTE — Telephone Encounter (Signed)
Spoke with patient and inquired if he wants [employer] papers sent in before he sees Dr Leta Baptist for FU . He stated "yes". Informed him that per Allene Pyo, RN, [employer], his papers from that company can wait to be submitted after his FU with Dr Leta Baptist. Patient verbalized understanding, appreciation.

## 2015-08-27 NOTE — Telephone Encounter (Signed)
error 

## 2015-08-30 ENCOUNTER — Ambulatory Visit: Payer: Self-pay | Admitting: Dietician

## 2015-08-30 NOTE — Telephone Encounter (Signed)
[employer] papers completed, signed, sent to MR for scanning. Patient requests copy be mailed to him, MR aware.

## 2015-08-31 ENCOUNTER — Telehealth: Payer: Self-pay | Admitting: Diagnostic Neuroimaging

## 2015-08-31 ENCOUNTER — Telehealth: Payer: Self-pay | Admitting: *Deleted

## 2015-08-31 ENCOUNTER — Encounter: Payer: Self-pay | Admitting: *Deleted

## 2015-08-31 NOTE — Telephone Encounter (Signed)
Spoke with patient and informed him his letter is ready; he requests it be mailed to his home. This will be done today. Also informed him Christella Scheuermann papers were completed yesterday and sent to MR for final processing. He verbalized understanding, appreciation.

## 2015-08-31 NOTE — Telephone Encounter (Signed)
Form,[employer] received, completed by  Dr Leta Baptist and  Leilani Able. Faxed,mailed patient a copy 08/31/15.

## 2015-08-31 NOTE — Telephone Encounter (Signed)
Patient is calling and states he needs an extended doctor note from November 15-November 29th.  Please call.

## 2015-09-02 NOTE — Telephone Encounter (Signed)
Aaron David [phone] called regarding short term disability claim, records received for 07/26/15 visit, exam findings all are noted to be normal, based on exam findings is the Dr placing him out of work and what is patient not able to do because of his symptoms. Needs further clarification.

## 2015-09-06 NOTE — Telephone Encounter (Signed)
Left VM for Aaron David explaining that patient continues to have dizziness, headaches and other issues that were explained on [employer] FMLA papers. Informed her pt has FU on 09/20/15, and Dr Leta Baptist will further assess pt for his ability to return to work. Left this caller's name number, office hours for any questions.

## 2015-09-06 NOTE — Telephone Encounter (Signed)
Spoke to [place] who is inquiring further of patient's inability to return to work. Informed her patient continues to suffer from HA, migraines, dizziness, daytime sleepiness, and these would interfere with his job performance and abilities per job description that was sent with his disability papers. She is inquiring if Christella Scheuermann may receive fax reports of MRI and Sleep Study. Per Butch Penny, MR information will be faxed to [place], [phone],  by her.

## 2015-09-06 NOTE — Telephone Encounter (Signed)
Sonia Baller returned MaryClare's call. She did receive msg but she a few more questions.

## 2015-09-20 ENCOUNTER — Encounter: Payer: Self-pay | Admitting: *Deleted

## 2015-09-20 ENCOUNTER — Encounter: Payer: Self-pay | Admitting: Diagnostic Neuroimaging

## 2015-09-20 ENCOUNTER — Ambulatory Visit (INDEPENDENT_AMBULATORY_CARE_PROVIDER_SITE_OTHER): Payer: BLUE CROSS/BLUE SHIELD | Admitting: Diagnostic Neuroimaging

## 2015-09-20 VITALS — BP 135/78 | HR 83 | Ht 64.0 in | Wt 136.4 lb

## 2015-09-20 DIAGNOSIS — G43009 Migraine without aura, not intractable, without status migrainosus: Secondary | ICD-10-CM | POA: Diagnosis not present

## 2015-09-20 DIAGNOSIS — R42 Dizziness and giddiness: Secondary | ICD-10-CM | POA: Diagnosis not present

## 2015-09-20 MED ORDER — AMITRIPTYLINE HCL 25 MG PO TABS: 25.0000 mg | ORAL_TABLET | Freq: Every day | ORAL | Status: DC

## 2015-09-20 NOTE — Patient Instructions (Signed)
Thank you for coming to see [place] at [facility]. I hope we have been able to provide you high quality care today.  You may receive a patient satisfaction survey over the next few weeks. We would appreciate your feedback and comments so that we may continue to improve ourselves and the health of our patients.  - gradually increase activities - start 5-10 minutes per day - may return to work Sep 22, 2015 with light duty (4-6 hours per day) - may return to work Oct 22, 2015 with no restrictions    ~~~~~~~~~~~~~~~~~~~~~~~~~~~~~~~~~~~~~~~~~~~~~~~~~~~~~~~~~~~~~~~~~  DR. Cherron Blitzer'S GUIDE TO HAPPY AND HEALTHY LIVING These are some of my general health and wellness recommendations. Some of them may apply to you better than others. Please use common sense as you try these suggestions and feel free to ask me any questions.   ACTIVITY/FITNESS Mental, social, emotional and physical stimulation are very important for brain and body health. Try learning a new activity (arts, music, language, sports, games).  Keep moving your body to the best of your abilities. You can do this at home, inside or outside, the park, community center, gym or anywhere you like. Consider a physical therapist or personal trainer to get started. Consider the app Sworkit. Fitness trackers such as smart-watches, smart-phones or Fitbits can help as well.   NUTRITION Eat more plants: colorful vegetables, nuts, seeds and berries.  Eat less sugar, salt, preservatives and processed foods.  Avoid toxins such as cigarettes and alcohol.  Drink water when you are thirsty. Warm water with a slice of lemon is an excellent morning drink to start the day.  Consider these websites for more information The Nutrition Source (https://www.henry-hernandez.biz/) Precision Nutrition (WindowBlog.ch)   RELAXATION Consider practicing mindfulness meditation or other relaxation  techniques such as deep breathing, prayer, yoga, tai chi, massage. See website mindful.org or the apps Headspace or Calm to help get started.   SLEEP Try to get at least 7-8+ hours sleep per day. Regular exercise and reduced caffeine will help you sleep better. Practice good sleep hygeine techniques. See website sleep.org for more information.   PLANNING Prepare estate planning, living will, healthcare POA documents. Sometimes this is best planned with the help of an attorney. Theconversationproject.org and agingwithdignity.org are excellent resources.

## 2015-09-20 NOTE — Progress Notes (Signed)
[facility]  PATIENT: Aaron David DOB: 1951/09/15  REFERRING CLINICIAN: Maxwell HISTORY FROM: patient and wife  REASON FOR VISIT: follow up    HISTORICAL  CHIEF COMPLAINT:  Chief Complaint  Patient presents with  . Dizziness    rm 6  . Follow-up    6 week    HISTORY OF PRESENT ILLNESS:   UPDATE 09/20/15: Since last visit, headaches are improved. Still with dizziness, insomnia. On TPX 25mg  at bedtime. Not been able to work, but now wants to try going back to work.   PRIOR HPI (07/26/15): 64 year old right-handed male with borderline diabetes, hypertension, here for evaluation of headaches and dizziness. In March 2016 patient fell down when he didn't realize that a chair had been moved out of the way, fell down onto his bottom and developed low back pain. No head pain or dizziness at that time. In June 2016 patient developed onset of generalized headache. Patient had MRI/MRA of the head which showed no acute findings. In July 2016 patient had onset of right-sided ringing in the ear, fullness in the right ear, increasing headaches, drums sound in the right ear. This was associated with nausea and vomiting, phonophobia. No photophobia. Since that time symptoms have slightly eased up but not gone away. He continues to have daily symptoms which fluctuate. No specific triggering factors. He went to see ENT who performed ENG testing raising possibility of CNS dysfunction. No peripheral vestibulopathy was found. Patient denies history of migraine headaches or significant headaches earlier in life. No family history of migraines. His aunt had some headaches but these were not specifically diagnosed. Patient does endorse some problems with snoring, insomnia, anxiety symptoms especially since the past 3 months. Also with significant daytime fatigue.   REVIEW OF SYSTEMS: Full 14 system review of systems performed and notable only for fatigue ringing in ears spinning  sensation allergies headache insomnia snoring weakness dizziness.   ALLERGIES: Allergies  Allergen Reactions  . Penicillins Rash    itching    HOME MEDICATIONS: Outpatient Prescriptions Prior to Visit  Medication Sig Dispense Refill  . albuterol (PROVENTIL HFA;VENTOLIN HFA) 108 (90 BASE) MCG/ACT inhaler Inhale 2 puffs into the lungs every 6 (six) hours as needed for wheezing or shortness of breath.    Marland Kitchen aspirin 81 MG tablet Take 81 mg by mouth daily.    . Azelastine-Fluticasone (DYMISTA) 137-50 MCG/ACT SUSP Place into the nose 2 (two) times daily before a meal.    . calcium carbonate 1250 MG capsule Take 1,250 mg by mouth 2 (two) times daily with a meal.    . carvedilol (COREG) 6.25 MG tablet     . lisinopril (PRINIVIL,ZESTRIL) 2.5 MG tablet     . metFORMIN (GLUCOPHAGE-XR) 500 MG 24 hr tablet     . montelukast (SINGULAIR) 10 MG tablet     . pantoprazole (PROTONIX) 40 MG tablet     . topiramate (TOPAMAX) 25 MG tablet Take 1-2 tablets (25-50 mg total) by mouth 2 (two) times daily. 120 tablet 3  . atorvastatin (LIPITOR) 40 MG tablet     . azelastine (OPTIVAR) 0.05 % ophthalmic solution     . levocetirizine (XYZAL) 5 MG tablet Take 5 mg by mouth every evening.    Marland Kitchen HYDROcodone-acetaminophen (NORCO/VICODIN) 5-325 MG tablet Take 1 tablet by mouth every 6 (six) hours as needed for moderate pain.     No facility-administered medications prior to visit.    PAST MEDICAL HISTORY: Past Medical History  Diagnosis Date  .  Acid reflux   . Borderline diabetes   . Headache     PAST SURGICAL HISTORY: No past surgical history on file.  FAMILY HISTORY: Family History  Problem Relation Age of Onset  . Diabetes Mother   . Heart disease Mother   . Asthma Father     SOCIAL HISTORY:  Social History   Social History  . Marital Status: Married    Spouse Name: [name]  . Number of Children: 2  . Years of Education: 16   Occupational History  .      [employer]   Social History  Main Topics  . Smoking status: Never Smoker   . Smokeless tobacco: Not on file  . Alcohol Use: No  . Drug Use: No  . Sexual Activity: Not on file   Other Topics Concern  . Not on file   Social History Narrative   Lives at home with wife, [name]   Caffeine use- coffee, tea 1 cup daily     PHYSICAL EXAM  GENERAL EXAM/CONSTITUTIONAL: Vitals:  Filed Vitals:   09/20/15 0846  BP: 135/78  Pulse: 83  Height: 5\' 4"  (1.626 m)  Weight: 136 lb 6.4 oz (61.871 kg)   Body mass index is 23.4 kg/(m^2). No exam data present  Patient is in no distress; well developed, nourished and groomed; neck is supple  CARDIOVASCULAR:  Examination of carotid arteries is normal; no carotid bruits  Regular rate and rhythm, no murmurs  Examination of peripheral vascular system by observation and palpation is normal  EYES:  Ophthalmoscopic exam of optic discs and posterior segments is normal; no papilledema or hemorrhages  MUSCULOSKELETAL:  Gait, strength, tone, movements noted in Neurologic exam below  NEUROLOGIC: MENTAL STATUS:  No flowsheet data found.  awake, alert, oriented to person, place and time  recent and remote memory intact  normal attention and concentration  language fluent, comprehension intact, naming intact,   fund of knowledge appropriate  CRANIAL NERVE:   2nd - no papilledema on fundoscopic exam  2nd, 3rd, 4th, 6th - pupils equal and reactive to light, visual fields full to confrontation, extraocular muscles intact, no nystagmus  5th - facial sensation symmetric  7th - facial strength symmetric  8th - hearing intact  9th - palate elevates symmetrically, uvula midline  11th - shoulder shrug symmetric  12th - tongue protrusion midline  MOTOR:   normal bulk and tone, full strength in the BUE, BLE  SENSORY:   normal and symmetric to light touch, temperature, vibration  COORDINATION:   finger-nose-finger, fine finger movements normal  REFLEXES:     deep tendon reflexes TRACE and symmetric  GAIT/STATION:   narrow based gait; CAUTIOUS, SLOW GAIT; able to walk tandem; romberg is negative    DIAGNOSTIC DATA (LABS, IMAGING, TESTING) - I reviewed patient records, labs, notes, testing and imaging myself where available.  No results found for: WBC, HGB, HCT, MCV, PLT No results found for: NA, K, CL, CO2, GLUCOSE, BUN, CREATININE, CALCIUM, PROT, ALBUMIN, AST, ALT, ALKPHOS, BILITOT, GFRNONAA, GFRAA No results found for: CHOL, HDL, LDLCALC, LDLDIRECT, TRIG, CHOLHDL No results found for: HGBA1C No results found for: VITAMINB12 No results found for: TSH  04/10/15 MRI brain [I reviewed images myself and agree with interpretation. -VRP]  - No acute intracranial abnormality. Mild chronic white matter changes likely related to microvascular ischemia - Chronic microhemorrhage right cerebellum, question hypertension  04/10/15 MRA head [I reviewed images myself and agree with interpretation. -VRP]  - Negative  08/12/15 MRI  brain and IAC - normal for age with just a few small T2/FLAIR hyperintense foci in the deep white matter of the hemispheres consistent with age appropriate chronic microvascular ischemic change. - The internal auditory canals and their contents appear normal. - Mild chronic ethmoid sinusitis. - No acute foci are noted.   ASSESSMENT AND PLAN  64 y.o. year old male here with new onset headaches in June 2016, now with intermittent vertigo, dizziness, nausea, right-sided throbbing headaches since July 2016. Patient had MRI study in June 2016 which showed no acute findings, but this was before onset of vertigo and nausea symptoms in July 2016. Repeat MRI scan was also unremarkable.  Migraine phenomenon is a possibility, but less likely given the fact the patient has never had migraine symptoms earlier in life and would be unusual for patient to start with migraine symptoms age 89 years old.   Also with insomnia, racing  thoughts, poor quality of sleep. Sleep study showed no sleep apnea, but did note some restless legs / PLMS.   Ddx: labyrinthitis vs migraine phenomenon vs insomnia  Dizziness and giddiness  Migraine without aura and without status migrainosus, not intractable   PLAN: - stop topiramate - start amitriptyline 25mg  at bedtime for migraine prevention and insomnia - gradually increase physical activity - ok to return to work with reduced schedule, as tolerated  Meds ordered this encounter  Medications  . amitriptyline (ELAVIL) 25 MG tablet    Sig: Take 1 tablet (25 mg total) by mouth at bedtime.    Dispense:  30 tablet    Refill:  6   Return in about 3 months (around 12/21/2015).    Penni Bombard, MD [id number], [id number] AM Certified in Neurology, Neurophysiology and Neuroimaging  [facility] [address] [place], [place] [address] [phone]

## 2015-09-20 NOTE — Progress Notes (Signed)
Papers faxed to [employer] for LOA and [employer] for short term disability. Patient given letter today to return to work, light duty on 09/22/15 and full time w/o restrictions on 10/22/15.

## 2015-09-20 NOTE — Telephone Encounter (Signed)
Phone call from [place] [phone] regarding information received for return to work light duty. Bernadette/[employer] needs to know what light duty means. Please call to advise.

## 2015-09-20 NOTE — Telephone Encounter (Signed)
Reduced overall hours, with ability to take breaks every 30-60 minutes. -VRP

## 2015-09-21 ENCOUNTER — Ambulatory Visit: Payer: BLUE CROSS/BLUE SHIELD | Admitting: Diagnostic Neuroimaging

## 2015-09-21 NOTE — Telephone Encounter (Signed)
Phone call from Bernadette/[employer] [phone] called to advise that she still hasn't heard back since message left yesterday regarding clarification what light duty means.

## 2015-09-21 NOTE — Telephone Encounter (Signed)
I have spoken with Aaron David and explained that per VRP, light duty for this pt. is reduced overall hrs with ability to take breaks every 30-60 min.  No other info given.  She verbalized understanding of same/fim

## 2015-09-23 ENCOUNTER — Telehealth: Payer: Self-pay | Admitting: *Deleted

## 2015-09-23 NOTE — Telephone Encounter (Signed)
Form,[employer] received,completed by Dr Leta Baptist and Margarito Courser faxed form 09/20/15.

## 2015-10-04 ENCOUNTER — Other Ambulatory Visit: Payer: Self-pay | Admitting: Nurse Practitioner

## 2015-12-08 ENCOUNTER — Telehealth: Payer: Self-pay | Admitting: Diagnostic Neuroimaging

## 2015-12-08 NOTE — Telephone Encounter (Signed)
error 

## 2015-12-14 ENCOUNTER — Telehealth: Payer: Self-pay | Admitting: Diagnostic Neuroimaging

## 2015-12-14 NOTE — Telephone Encounter (Signed)
Pt would like a call back from nurse. He said he had a question but would not tell me. May call [phone]

## 2015-12-14 NOTE — Telephone Encounter (Signed)
LVM requesting call back.

## 2015-12-15 NOTE — Telephone Encounter (Signed)
Patient returned call, when calling back, patient will be unavailable to talk between 11:30am-1:00pm today.

## 2015-12-15 NOTE — Telephone Encounter (Signed)
Spoke with patient who stated he received e mail from [place] stating they will not pay for his MRI "because it was coded wrong". He stated he will bring copy of e mail to this office next week for Dr Leta Baptist to review. He then expressed appreciation and thanks for the care he has received from this RN and Dr Leta Baptist. He stated the last medication Dr Leta Baptist prescribed has been very helpful, and he is feeling much better. This RN expressed our gratitude for his compliments.

## 2015-12-21 ENCOUNTER — Ambulatory Visit: Payer: BLUE CROSS/BLUE SHIELD | Admitting: Diagnostic Neuroimaging

## 2016-01-06 ENCOUNTER — Ambulatory Visit (INDEPENDENT_AMBULATORY_CARE_PROVIDER_SITE_OTHER): Payer: Commercial Managed Care - HMO | Admitting: Diagnostic Neuroimaging

## 2016-01-06 ENCOUNTER — Encounter: Payer: Self-pay | Admitting: Diagnostic Neuroimaging

## 2016-01-06 VITALS — BP 129/81 | HR 87 | Ht 64.0 in | Wt 139.0 lb

## 2016-01-06 DIAGNOSIS — G43009 Migraine without aura, not intractable, without status migrainosus: Secondary | ICD-10-CM | POA: Diagnosis not present

## 2016-01-06 DIAGNOSIS — R42 Dizziness and giddiness: Secondary | ICD-10-CM | POA: Diagnosis not present

## 2016-01-06 MED ORDER — AMITRIPTYLINE HCL 25 MG PO TABS: 25.0000 mg | ORAL_TABLET | Freq: Every day | ORAL | Status: DC

## 2016-01-06 NOTE — Progress Notes (Addendum)
[facility]  PATIENT: Aaron David DOB: 07/29/1951  REFERRING CLINICIAN: Maxwell HISTORY FROM: patient REASON FOR VISIT: follow up    HISTORICAL  CHIEF COMPLAINT:  Chief Complaint  Patient presents with  . Dizziness    rm 6, "much less dizziness now"  . Follow-up    3 month    HISTORY OF PRESENT ILLNESS:   UPDATE 01/06/16: Since last visit, dizziness improved. Now has taken early retirement package offered by his company. Tolerating amitriptyline.   UPDATE 09/20/15: Since last visit, headaches are improved. Still with dizziness, insomnia. On TPX 25mg  at bedtime. Not been able to work, but now wants to try going back to work.   PRIOR HPI (07/26/15): 65 year old right-handed male with borderline diabetes, hypertension, here for evaluation of headaches and dizziness. In March 2016 patient fell down when he didn't realize that a chair had been moved out of the way, fell down onto his bottom and developed low back pain. No head pain or dizziness at that time. In June 2016 patient developed onset of generalized headache. Patient had MRI/MRA of the head which showed no acute findings. In July 2016 patient had onset of right-sided ringing in the ear, fullness in the right ear, increasing headaches, drums sound in the right ear. This was associated with nausea and vomiting, phonophobia. No photophobia. Since that time symptoms have slightly eased up but not gone away. He continues to have daily symptoms which fluctuate. No specific triggering factors. He went to see ENT who performed ENG testing raising possibility of CNS dysfunction. No peripheral vestibulopathy was found. Patient denies history of migraine headaches or significant headaches earlier in life. No family history of migraines. His aunt had some headaches but these were not specifically diagnosed. Patient does endorse some problems with snoring, insomnia, anxiety symptoms especially since the past 3 months.  Also with significant daytime fatigue.   REVIEW OF SYSTEMS: Full 14 system review of systems performed and notable only for fatigue ringing in ears spinning sensation allergies headache insomnia snoring weakness dizziness.   ALLERGIES: Allergies  Allergen Reactions  . Penicillins Rash    itching    HOME MEDICATIONS: Outpatient Prescriptions Prior to Visit  Medication Sig Dispense Refill  . albuterol (PROVENTIL HFA;VENTOLIN HFA) 108 (90 BASE) MCG/ACT inhaler Inhale 2 puffs into the lungs every 6 (six) hours as needed for wheezing or shortness of breath.    Marland Kitchen amitriptyline (ELAVIL) 25 MG tablet Take 1 tablet (25 mg total) by mouth at bedtime. 30 tablet 6  . aspirin 81 MG tablet Take 81 mg by mouth daily.    Marland Kitchen atorvastatin (LIPITOR) 40 MG tablet     . azelastine (OPTIVAR) 0.05 % ophthalmic solution     . Azelastine-Fluticasone (DYMISTA) 137-50 MCG/ACT SUSP Place into the nose 2 (two) times daily before a meal.    . carvedilol (COREG) 6.25 MG tablet     . lisinopril (PRINIVIL,ZESTRIL) 2.5 MG tablet     . metFORMIN (GLUCOPHAGE-XR) 500 MG 24 hr tablet     . montelukast (SINGULAIR) 10 MG tablet     . pantoprazole (PROTONIX) 40 MG tablet     . calcium carbonate 1250 MG capsule Take 1,250 mg by mouth 2 (two) times daily with a meal.    . levocetirizine (XYZAL) 5 MG tablet Take 5 mg by mouth every evening.     No facility-administered medications prior to visit.    PAST MEDICAL HISTORY: Past Medical History  Diagnosis Date  . Acid reflux   .  Borderline diabetes   . Headache     PAST SURGICAL HISTORY: No past surgical history on file.  FAMILY HISTORY: Family History  Problem Relation Age of Onset  . Diabetes Mother   . Heart disease Mother   . Asthma Father     SOCIAL HISTORY:  Social History   Social History  . Marital Status: Married    Spouse Name: [name]  . Number of Children: 2  . Years of Education: 16   Occupational History  .      [employer]   Social  History Main Topics  . Smoking status: Never Smoker   . Smokeless tobacco: Not on file  . Alcohol Use: No  . Drug Use: No  . Sexual Activity: Not on file   Other Topics Concern  . Not on file   Social History Narrative   Lives at home with wife, [name]   Caffeine use- coffee, tea 1 cup daily     PHYSICAL EXAM  GENERAL EXAM/CONSTITUTIONAL: Vitals:  Filed Vitals:   01/06/16 1259  BP: 129/81  Pulse: 87  Height: 5\' 4"  (1.626 m)  Weight: 139 lb (63.05 kg)   Body mass index is 23.85 kg/(m^2). No exam data present  Patient is in no distress; well developed, nourished and groomed; neck is supple  CARDIOVASCULAR:  Examination of carotid arteries is normal; no carotid bruits  Regular rate and rhythm, no murmurs  Examination of peripheral vascular system by observation and palpation is normal  EYES:  Ophthalmoscopic exam of optic discs and posterior segments is normal; no papilledema or hemorrhages  MUSCULOSKELETAL:  Gait, strength, tone, movements noted in Neurologic exam below  NEUROLOGIC: MENTAL STATUS:  No flowsheet data found.  awake, alert, oriented to person, place and time  recent and remote memory intact  normal attention and concentration  language fluent, comprehension intact, naming intact,   fund of knowledge appropriate  CRANIAL NERVE:   2nd - no papilledema on fundoscopic exam  2nd, 3rd, 4th, 6th - pupils equal and reactive to light, visual fields full to confrontation, extraocular muscles intact, no nystagmus  5th - facial sensation symmetric  7th - facial strength symmetric  8th - hearing intact  9th - palate elevates symmetrically, uvula midline  11th - shoulder shrug symmetric  12th - tongue protrusion midline  MOTOR:   normal bulk and tone, full strength in the BUE, BLE  SENSORY:   normal and symmetric to light touch, temperature, vibration  COORDINATION:   finger-nose-finger, fine finger movements normal  REFLEXES:     deep tendon reflexes TRACE and symmetric  GAIT/STATION:   narrow based gait; CAUTIOUS, SLOW GAIT; able to walk tandem; romberg is negative    DIAGNOSTIC DATA (LABS, IMAGING, TESTING) - I reviewed patient records, labs, notes, testing and imaging myself where available.  No results found for: WBC, HGB, HCT, MCV, PLT No results found for: NA, K, CL, CO2, GLUCOSE, BUN, CREATININE, CALCIUM, PROT, ALBUMIN, AST, ALT, ALKPHOS, BILITOT, GFRNONAA, GFRAA No results found for: CHOL, HDL, LDLCALC, LDLDIRECT, TRIG, CHOLHDL No results found for: HGBA1C No results found for: VITAMINB12 No results found for: TSH  04/10/15 MRI brain [I reviewed images myself and agree with interpretation. -VRP]  - No acute intracranial abnormality. Mild chronic white matter changes likely related to microvascular ischemia - Chronic microhemorrhage right cerebellum, question hypertension  04/10/15 MRA head [I reviewed images myself and agree with interpretation. -VRP]  - Negative  08/12/15 MRI brain and IAC - normal for age  with just a few small T2/FLAIR hyperintense foci in the deep white matter of the hemispheres consistent with age appropriate chronic microvascular ischemic change. - The internal auditory canals and their contents appear normal. - Mild chronic ethmoid sinusitis. - No acute foci are noted.    ASSESSMENT AND PLAN  65 y.o. year old male here with new onset headaches in June 2016, now with intermittent vertigo, dizziness, nausea, right-sided throbbing headaches since July 2016. Patient had MRI study in June 2016 which showed no acute findings, but this was before onset of vertigo and nausea symptoms in July 2016. Repeat MRI scan was also unremarkable.  Migraine phenomenon is a possibility, but less likely given the fact the patient has never had migraine symptoms earlier in life and would be unusual for patient to start with migraine symptoms age 34 years old.   Also with insomnia, racing  thoughts, poor quality of sleep. Sleep study showed no sleep apnea, but did note some restless legs / PLMS.   Overall improving with amitriptyline and time.    Ddx: labyrinthitis vs migraine phenomenon vs insomnia  Dizziness and giddiness  Migraine without aura and without status migrainosus, not intractable   PLAN: - continue amitriptyline 25mg  at bedtime for migraine prevention and insomnia - gradually increase physical activity - regarding disability claim which was denied: Of note, in retrospect, patient was not able to work from 07/26/15 until 09/22/15 due to severe headaches, dizziness, imbalance, trouble concentrating, leading to severe functional impairment. In addition, patient was actually in visible distress during prior office visit on 07/26/15, although not correctly noted in my office note. I had recommended return to light duty work on 09/22/15 and full time work without restrictions on 10/22/15.  Meds ordered this encounter  Medications  . amitriptyline (ELAVIL) 25 MG tablet    Sig: Take 1 tablet (25 mg total) by mouth at bedtime.    Dispense:  30 tablet    Refill:  6   Return in about 4 months (around 05/07/2016).    Penni Bombard, MD [id number], [id number] PM Certified in Neurology, Neurophysiology and Neuroimaging  [facility] [address] [place], [place] [address] [phone]

## 2016-01-06 NOTE — Patient Instructions (Signed)
-   Continue amitriptyline

## 2016-05-09 ENCOUNTER — Ambulatory Visit: Payer: Commercial Managed Care - HMO | Admitting: Diagnostic Neuroimaging

## 2016-05-23 ENCOUNTER — Ambulatory Visit: Payer: Commercial Managed Care - HMO | Admitting: Diagnostic Neuroimaging

## 2016-07-17 ENCOUNTER — Other Ambulatory Visit: Payer: Self-pay | Admitting: Sports Medicine

## 2016-07-17 DIAGNOSIS — M25562 Pain in left knee: Secondary | ICD-10-CM

## 2016-07-25 ENCOUNTER — Ambulatory Visit
Admission: RE | Admit: 2016-07-25 | Discharge: 2016-07-25 | Disposition: A | Discharge status: Home / Self Care | Payer: Commercial Managed Care - HMO | Source: Ambulatory Visit | Attending: Sports Medicine | Admitting: Sports Medicine

## 2016-07-25 DIAGNOSIS — M25562 Pain in left knee: Secondary | ICD-10-CM

## 2017-01-22 DIAGNOSIS — I209 Angina pectoris, unspecified: Secondary | ICD-10-CM | POA: Diagnosis present

## 2017-01-22 NOTE — H&P (Signed)
OFFICE VISIT NOTES COPIED TO EPIC FOR DOCUMENTATION  . History of Present Illness Gwinda Maine FNP-C; 02/01/2017 2:40 PM) Patient words: Last O/V 01/16/2017; Acute visit for CP since yesterday - Pt took a Ntg last night and it helped.  The patient is a 66 year old male who presents with coronary artery disease.  Additional reasons for visit:  Follow-up for Chest pain is described as the following: Patient presents for acute visit for chest pain. He reports working in his yard yesterday and started having left-sided chest pain that radiated to his left shoulder. He also had associated shortness of breath. He reports resolution of symptoms after resting for 15-20 mins. Throughout the night he continued to have on and off chest pain with climbing stairs or walking around his house. He did take 1 nitroglycerin, which did improve symptoms. He reports he has had mild on and off chest pain today.  Past medical history includes T2DM, hypertension, and hyperlipidemia. Cardiac catheterization in 2003, was told to have no significant disease. Strong +FH for premature CAD. Nonsmoker. He was diagnosed with T2DM 2 years ago and is managed with metformin.  He does not exercise regularly. He reports shortness of breath with climbing stairs. Denies PND, orthopnea, palpitations, edema, syncopal episodes, or symptoms of claudication or TIA.   Problem List/Past Medical (April Louretta Shorten; 02/01/2017 1:25 PM) Controlled type 2 diabetes mellitus without complication, without long-term current use of insulin (E11.9)  Hypercholesteremia (E78.00)  Labs 07/14/2014: BMP was normal except elevated blood sugar of 180 mg. Vitamin D 30.6, total cholesterol 193, triglycerides 138, HDL 36, LDL 130. Non-HDL cholesterol 158. Essential hypertension, benign (I10)  Gastroesophageal reflux disease without esophagitis (K21.9)  Ultrasound abdomen 11/03/2014: Normal NM Hepato w/EF 11/03/2014: Normal exam Shortness of breath on exertion  (R06.02)  Atypical chest pain (R07.89)  EKG 01/16/2017: Normal sinus rhythm at 91 bpm, normal axis, normal interval, no evidence of ischemia. Normal EKG. Unchanged from EKG 10/09/2014. 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 Exercise sestamibi stress test 10/21/2014: 1. The resting electrocardiogram demonstrated normal sinus rhythm, normal resting conduction, no resting arrhythmias and normal rest repolarization. The stress electrocardiogram was at most equivocal for ischemia. There was 1 mm upsloping ST depression with exercise, back to baseline into recovery. Stress test terminated due to fatigue. Normal blood pressure response. Exercised for 5 minutes on Bruce protocol. The patient completed an estimated workload of 7.4 METS. 2. Myocardial perfusion imaging is normal. Overall left ventricular systolic function was normal without regional wall motion abnormalities. The left ventricular ejection fraction was 52%.  Allergies (April Garrison; February 01, 2017 1:25 PM) Penicillins  Itching. Dexilant *ULCER DRUGS*  Ineffective  Family History (April Louretta Shorten; 02/01/2017 1:38 PM) Mother  Deceased. at age 15 from DM, CHF Father  Deceased. at age 85 from CAD, Stroke, Bronchitis Siblings  3 Living brothers; 2-Deceased; 2 sisters ( hx of CAD, DM, HTN, ESRD)  Social History (April Garrison; 02/01/17 1:25 PM) Current tobacco use  Never smoker. Non Drinker/No Alcohol Use  Marital status  Married. Number of Children  2. Living Situation  Lives with spouse.  Past Surgical History (April Louretta Shorten; 01-Feb-2017 1:25 PM) None 10/09/2014  Medication History (April Garrison; 02/01/2017 1:40 PM) Carvedilol (6.25MG  Tablet, 1 (one) Tablet Tablet Oral two times daily, Taken starting 10/04/2015) Active. Nitrostat (0.4MG  Tab Sublingual, 1 (one) Tab Sublingual Tab Sub Sublingual every 5 minutes as needed for  chest pain.,  Taken starting 10/09/2014) Active. Lisinopril (2.5MG  Tablet, 1 Oral daily) Active. Vitamin D3 (2000UNIT Capsule, 2 Oral daily) Active. Centrum Silver Ultra Mens (1 Oral daily) Active. Allergy Relief (10MG  Tablet, 1 Oral daily) Active. Aspirin (81MG  Tablet, 1 Oral daily) Active. Vitamin B12 (100MCG Tablet, 1 Oral daily) Active. Fexofenadine HCl (180MG  Tablet, 1 Oral as needed) Active. Fluticasone Propionate (Nasal) (50MCG/ACT Suspension, 2 sprays each nostril Nasal as needed) Active. Omeprazole (20MG  Tablet DR, 1 Oral daily as needed) Active. MetFORMIN HCl ER (500MG  Tablet ER 24HR, 1 Oral daily) Active. EPINEPHrine (0.3MG /0.3ML Soln Auto-inj, 1 injection Injection weekly) Active. Montelukast Sodium (10MG  Tablet, 1 Oral daily) Active. Medications Reconciled (verbally, no meds or list present)  Diagnostic Studies History (April Garrison; 01/23/2017 1:27 PM) Coronary Angiogram 2003 No Stents Echocardiogram 2003 Normal. Chest X-ray 01/01/2009 IMPRESSION: No acute chest findings. Pulmonary hyperaeration raises the question of COPD/emphysema. Old granulomatous disease.  Colonoscopy 2013 Normal. Endoscopy 2013 Normal. MRI Brain, Brain Stem 2016 Normal. Nuclear stress test 10/21/2014 1. The resting electrocardiogram demonstrated normal sinus rhythm, normal resting conduction, no resting arrhythmias and normal rest repolarization. The stress electrocardiogram was at most equivocal for ischemia. There was 1 mm upsloping ST depression with exercise, back to baseline into recovery. Stress test terminated due to fatigue. Normal blood pressure response. The patient completed an estimated workload of 7.4 METS. 2. Myocardial perfusion imaging is normal. Overall left ventricular systolic function was normal without regional wall motion abnormalities. The left ventricular ejection fraction was 52%.    Review of Systems Gwinda Maine FNP-C; 2017/01/23 2:17 PM) General  Present- Fatigue (with activities) and Feeling well. Not Present- Fever and Night Sweats. Skin Not Present- Itching and Rash. HEENT Present- Headache. Respiratory Present- Dyspnea and Snoring. Cardiovascular Present- Chest Pain (recent worsening chest pain with activities) and Difficulty Breathing On Exertion. Not Present- Claudications, Fainting, Orthopnea, Paroxysmal Nocturnal Dyspnea and Swelling of Extremities. Gastrointestinal Not Present- Abdominal Pain, Constipation, Diarrhea, Nausea and Vomiting. Musculoskeletal Not Present- Joint Swelling. Neurological Present- Vertigo. Not Present- Headaches. Hematology Not Present- Blood Clots, Easy Bruising and Nose Bleed. All other systems negative  Vitals (April Garrison; 01/23/17 1:43 PM) 01-23-2017 1:33 PM Weight: 134.25 lb Height: 68in Body Surface Area: 1.73 m Body Mass Index: 20.41 kg/m  Pulse: 87 (Regular)  P.OX: 98% (Room air) BP: 138/78 (Sitting, Left Arm, Standard)       Physical Exam Gwinda Maine, FNP-C; 2017/01/23 2:40 PM) General Mental Status-Alert. General Appearance-Cooperative, Appears stated age, Not in acute distress. Orientation-Oriented X3. Build & Nutrition-Well built.  Head and Neck Thyroid Gland Characteristics - no palpable nodules, no palpable enlargement.  Chest and Lung Exam Chest and lung exam reveals -quiet, even and easy respiratory effort with no use of accessory muscles. Palpation Tender - No chest wall tenderness.  Cardiovascular Cardiovascular examination reveals -normal heart sounds, regular rate and rhythm with no murmurs, carotid auscultation reveals no bruits, femoral artery auscultation bilaterally reveals normal pulses, no bruits, no thrills, normal pedal pulses bilaterally and no digital clubbing, cyanosis, edema, increased warmth or tenderness. Inspection Jugular vein - Right - No Distention.  Abdomen Palpation/Percussion Normal exam - Non Tender and No  hepatosplenomegaly. Auscultation Normal exam - Bowel sounds normal.  Neurologic Motor-Grossly intact without any focal deficits.  Musculoskeletal Global Assessment Left Lower Extremity - normal range of motion without pain. Right Lower Extremity - normal range of motion without pain.    Assessment & Plan Gwinda Maine FNP-C; Jan 23, 2017 2:38 PM) Angina pectoris (I20.9) Story: Cardiac cath at [facility]  2003 told to be normal limits.  Exercise sestamibi stress test 10/21/2014: 1. The resting electrocardiogram demonstrated normal sinus rhythm, normal resting conduction, no resting arrhythmias and normal rest repolarization. The stress electrocardiogram was at most equivocal for ischemia. There was 1 mm upsloping ST depression with exercise, back to baseline into recovery. Stress test terminated due to fatigue. Normal blood pressure response. Exercised for 5 minutes on Bruce protocol. The patient completed an estimated workload of 7.4 METS. 2. Myocardial perfusion imaging is normal. Overall left ventricular systolic function was normal without regional wall motion abnormalities. The left ventricular ejection fraction was 52%. Impression: EKG 01/16/2017: Normal sinus rhythm at 91 bpm, normal axis, normal interval, no evidence of ischemia. Normal EKG. Unchanged from EKG 10/09/2014. Current Plans Complete electrocardiogram (93000) Started AmLODIPine Besylate 5MG , 1 (one) Tablet daily, #30, 30 days starting 01/22/2017, No Refill. Shortness of breath on exertion (R06.02) Hypercholesteremia (E78.00) Story: Labs 07/14/2014: BMP was normal except elevated blood sugar of 180 mg. Vitamin D 30.6, total cholesterol 193, triglycerides 138, HDL 36, LDL 130. Non-HDL cholesterol 158. Essential hypertension, benign (I10) Impression: EKG 10/04/2015: Normal sinus rhythm at rate of 84 bpm, normal axis. No evidence of ischemia, normal EKG. Current Plans Mechanism of underlying disease process and action of  medications discussed with the patient. I discussed primary/secondary prevention and also dietary counceling was done. Patient presents for acute visit for chest pain. Patient was seen last week for chest pain symptoms and symptoms were felt to be atypical. Symptoms today are concerning for angina pectoris as symptoms occured with excertion and since he still feels not well. He had relief with nitroglycerin use. He reports altering activities due to his symptoms, but has continued to due activities such as running errands. No EKG changes noted today. He is hypertensive today, will start him on amlodipine both for angina pain and blood pressure. As he does have risk factors for coronary disease and persistent symptoms, I feel that we should proceed for coronary angiogram. Schedule for cardiac catheterization, and possible angioplasty. We discussed regarding risks, benefits, alternatives to this including stress testing, CTA and continued medical therapy. Patient wants to proceed. Understands <1-2% risk of death, stroke, MI, urgent CABG, bleeding, infection, renal failure but not limited to these. Will call and discuss with his son, who is a PA. Patient verbalizes understanding and agreeance to plan. I will have him follow up after the procedure for re-evaluation.  I have advised him to rest today and to use nitroglycerin as needed and if symptoms persist to proceed to the ER.  CC: Dr. Maurice Small  Signed electronically by Gwinda Maine, FNP-C (01/22/2017 2:41 PM)

## 2017-01-23 ENCOUNTER — Ambulatory Visit (HOSPITAL_COMMUNITY)
Admission: RE | Disposition: A | Discharge status: Home / Self Care | Payer: Self-pay | Source: Ambulatory Visit | Attending: Cardiology

## 2017-01-23 ENCOUNTER — Ambulatory Visit (HOSPITAL_COMMUNITY)
Admission: RE | Admit: 2017-01-23 | Discharge: 2017-01-23 | Disposition: A | Discharge status: Home / Self Care | Payer: Commercial Managed Care - HMO | Source: Ambulatory Visit | Attending: Cardiology | Admitting: Cardiology

## 2017-01-23 DIAGNOSIS — I10 Essential (primary) hypertension: Secondary | ICD-10-CM | POA: Insufficient documentation

## 2017-01-23 DIAGNOSIS — I209 Angina pectoris, unspecified: Secondary | ICD-10-CM | POA: Diagnosis present

## 2017-01-23 DIAGNOSIS — E119 Type 2 diabetes mellitus without complications: Secondary | ICD-10-CM | POA: Diagnosis not present

## 2017-01-23 DIAGNOSIS — E7800 Pure hypercholesterolemia, unspecified: Secondary | ICD-10-CM | POA: Insufficient documentation

## 2017-01-23 DIAGNOSIS — Z7984 Long term (current) use of oral hypoglycemic drugs: Secondary | ICD-10-CM | POA: Diagnosis not present

## 2017-01-23 DIAGNOSIS — Z7982 Long term (current) use of aspirin: Secondary | ICD-10-CM | POA: Diagnosis not present

## 2017-01-23 DIAGNOSIS — Z8249 Family history of ischemic heart disease and other diseases of the circulatory system: Secondary | ICD-10-CM | POA: Insufficient documentation

## 2017-01-23 DIAGNOSIS — Z823 Family history of stroke: Secondary | ICD-10-CM | POA: Diagnosis not present

## 2017-01-23 DIAGNOSIS — Z7951 Long term (current) use of inhaled steroids: Secondary | ICD-10-CM | POA: Insufficient documentation

## 2017-01-23 DIAGNOSIS — I25119 Atherosclerotic heart disease of native coronary artery with unspecified angina pectoris: Secondary | ICD-10-CM | POA: Insufficient documentation

## 2017-01-23 DIAGNOSIS — K219 Gastro-esophageal reflux disease without esophagitis: Secondary | ICD-10-CM | POA: Insufficient documentation

## 2017-01-23 DIAGNOSIS — Z833 Family history of diabetes mellitus: Secondary | ICD-10-CM | POA: Diagnosis not present

## 2017-01-23 HISTORY — PX: LEFT HEART CATH AND CORONARY ANGIOGRAPHY: CATH118249

## 2017-01-23 LAB — CBC
HEMATOCRIT: 41.4 % (ref 39.0–52.0)
Hemoglobin: 14.1 g/dL (ref 13.0–17.0)
MCH: 29.5 pg (ref 26.0–34.0)
MCHC: 34.1 g/dL (ref 30.0–36.0)
MCV: 86.6 fL (ref 78.0–100.0)
PLATELETS: 141 10*3/uL — AB (ref 150–400)
RBC: 4.78 MIL/uL (ref 4.22–5.81)
RDW: 12.6 % (ref 11.5–15.5)
WBC: 7.8 10*3/uL (ref 4.0–10.5)

## 2017-01-23 LAB — BASIC METABOLIC PANEL
Anion gap: 6 (ref 5–15)
BUN: 7 mg/dL (ref 6–20)
CHLORIDE: 108 mmol/L (ref 101–111)
CO2: 27 mmol/L (ref 22–32)
CREATININE: 0.86 mg/dL (ref 0.61–1.24)
Calcium: 8.5 mg/dL — ABNORMAL LOW (ref 8.9–10.3)
GFR calc Af Amer: >60 mL/min (ref >60)
GFR calc non Af Amer: >60 mL/min (ref >60)
GLUCOSE: 131 mg/dL — AB (ref 65–99)
Potassium: 4 mmol/L (ref 3.5–5.1)
SODIUM: 141 mmol/L (ref 135–145)

## 2017-01-23 LAB — PROTIME-INR
INR: 1.07
PROTHROMBIN TIME: 14 s (ref 11.4–15.2)

## 2017-01-23 LAB — GLUCOSE, CAPILLARY: Glucose-Capillary: 109 mg/dL — ABNORMAL HIGH (ref 65–99)

## 2017-01-23 SURGERY — LEFT HEART CATH AND CORONARY ANGIOGRAPHY
Anesthesia: LOCAL

## 2017-01-23 MED ORDER — HEPARIN (PORCINE) IN NACL 2-0.9 UNIT/ML-% IJ SOLN
INTRAMUSCULAR | Status: AC
  Filled 2017-01-23: qty 1000

## 2017-01-23 MED ORDER — HYDROMORPHONE HCL 1 MG/ML IJ SOLN
INTRAMUSCULAR | Status: AC
  Filled 2017-01-23: qty 1

## 2017-01-23 MED ORDER — MIDAZOLAM HCL 2 MG/2ML IJ SOLN
INTRAMUSCULAR | Status: DC | PRN
  Administered 2017-01-23: 2 mg via INTRAVENOUS

## 2017-01-23 MED ORDER — SODIUM CHLORIDE 0.9% FLUSH: 3.0000 mL | INTRAVENOUS | Status: DC | PRN

## 2017-01-23 MED ORDER — VERAPAMIL HCL 2.5 MG/ML IV SOLN
INTRAVENOUS | Status: AC
  Filled 2017-01-23: qty 2

## 2017-01-23 MED ORDER — LIDOCAINE HCL (PF) 1 % IJ SOLN
INTRAMUSCULAR | Status: AC
  Filled 2017-01-23: qty 30

## 2017-01-23 MED ORDER — IOPAMIDOL (ISOVUE-370) INJECTION 76%
INTRAVENOUS | Status: AC
  Filled 2017-01-23: qty 100

## 2017-01-23 MED ORDER — SODIUM CHLORIDE 0.9 % WEIGHT BASED INFUSION: 1.0000 mL/kg/h | INTRAVENOUS | Status: DC

## 2017-01-23 MED ORDER — SODIUM CHLORIDE 0.9% FLUSH: 3.0000 mL | Freq: Two times a day (BID) | INTRAVENOUS | Status: DC

## 2017-01-23 MED ORDER — ASPIRIN 81 MG PO CHEW
81.0000 mg | CHEWABLE_TABLET | ORAL | Status: AC
  Administered 2017-01-23: 81 mg via ORAL

## 2017-01-23 MED ORDER — NITROGLYCERIN IN D5W 200-5 MCG/ML-% IV SOLN
INTRAVENOUS | Status: AC
  Filled 2017-01-23: qty 250

## 2017-01-23 MED ORDER — IOPAMIDOL (ISOVUE-370) INJECTION 76%
INTRAVENOUS | Status: DC | PRN
  Administered 2017-01-23: 40 mL via INTRA_ARTERIAL

## 2017-01-23 MED ORDER — ASPIRIN 81 MG PO CHEW
CHEWABLE_TABLET | ORAL | Status: AC
  Administered 2017-01-23: 81 mg via ORAL
  Filled 2017-01-23: qty 1

## 2017-01-23 MED ORDER — HEPARIN SODIUM (PORCINE) 1000 UNIT/ML IJ SOLN
INTRAMUSCULAR | Status: DC | PRN
  Administered 2017-01-23: 3000 [IU] via INTRAVENOUS

## 2017-01-23 MED ORDER — SODIUM CHLORIDE 0.9 % WEIGHT BASED INFUSION
3.0000 mL/kg/h | INTRAVENOUS | Status: AC
  Administered 2017-01-23: 3 mL/kg/h via INTRAVENOUS

## 2017-01-23 MED ORDER — HYDROMORPHONE HCL 1 MG/ML IJ SOLN
INTRAMUSCULAR | Status: DC | PRN
  Administered 2017-01-23: 0.5 mg via INTRAVENOUS

## 2017-01-23 MED ORDER — SODIUM CHLORIDE 0.9 % IV SOLN: 250.0000 mL | INTRAVENOUS | Status: DC | PRN

## 2017-01-23 MED ORDER — LIDOCAINE HCL (PF) 1 % IJ SOLN
INTRAMUSCULAR | Status: DC | PRN
  Administered 2017-01-23: 2 mL

## 2017-01-23 MED ORDER — VERAPAMIL HCL 2.5 MG/ML IV SOLN
INTRA_ARTERIAL | Status: DC | PRN
  Administered 2017-01-23: 10 mL via INTRA_ARTERIAL

## 2017-01-23 MED ORDER — MIDAZOLAM HCL 2 MG/2ML IJ SOLN
INTRAMUSCULAR | Status: AC
  Filled 2017-01-23: qty 2

## 2017-01-23 MED ORDER — METFORMIN HCL ER 500 MG PO TB24: 500.0000 mg | ORAL_TABLET | Freq: Every day | ORAL | Status: DC

## 2017-01-23 MED ORDER — HEPARIN SODIUM (PORCINE) 1000 UNIT/ML IJ SOLN
INTRAMUSCULAR | Status: AC
  Filled 2017-01-23: qty 1

## 2017-01-23 SURGICAL SUPPLY — 9 items
CATH OPTITORQUE TIG 4.0 5F (CATHETERS) ×1 IMPLANT
DEVICE RAD COMP TR BAND LRG (VASCULAR PRODUCTS) ×1 IMPLANT
GLIDESHEATH SLEND A-KIT 6F 20G (SHEATH) ×1 IMPLANT
GUIDEWIRE INQWIRE 1.5J.035X260 (WIRE) IMPLANT
INQWIRE 1.5J .035X260CM (WIRE) ×2
KIT HEART LEFT (KITS) ×2 IMPLANT
PACK CARDIAC CATHETERIZATION (CUSTOM PROCEDURE TRAY) ×2 IMPLANT
TRANSDUCER W/STOPCOCK (MISCELLANEOUS) ×2 IMPLANT
TUBING CIL FLEX 10 FLL-RA (TUBING) ×2 IMPLANT

## 2017-01-23 NOTE — Progress Notes (Signed)
Assumed care of pt from Larey Dresser, RN. Assessment documented.

## 2017-01-23 NOTE — Interval H&P Note (Signed)
History and Physical Interval Note:  01/23/2017 1:47 PM  Aaron David  has presented today for surgery, with the diagnosis of cp  The various methods of treatment have been discussed with the patient and family. After consideration of risks, benefits and other options for treatment, the patient has consented to  Procedure(s): Left Heart Cath and Coronary Angiography (N/A) and possible PCI as a surgical intervention .  The patient's history has been reviewed, patient examined, no change in status, stable for surgery.  I have reviewed the patient's chart and labs.  Questions were answered to the patient's satisfaction.   Ischemic Symptoms? CCS III (Marked limitation of ordinary activity) Anti-ischemic Medical Therapy? Minimal Therapy (1 class of medications) Non-invasive Test Results? No non-invasive testing performed Prior CABG? No Previous CABG   Patient Information:   1-2V CAD, no prox LAD  A (7)  Indication: 20; Score: 7   Patient Information:   1-2V-CAD with DS 50-60% With No FFR, No IVUS  I (3)  Indication: 21; Score: 3   Patient Information:   1-2V-CAD with DS 50-60% With FFR  A (7)  Indication: 22; Score: 7   Patient Information:   1-2V-CAD with DS 50-60% With FFR>0.8, IVUS not significant  I (2)  Indication: 23; Score: 2   Patient Information:   3V-CAD without LMCA With Abnormal LV systolic function  A (9)  Indication: 48; Score: 9   Patient Information:   LMCA-CAD  A (9)  Indication: 49; Score: 9   Patient Information:   2V-CAD with prox LAD PCI  A (7)  Indication: 62; Score: 7   Patient Information:   2V-CAD with prox LAD CABG  A (8)  Indication: 62; Score: 8   Patient Information:   3V-CAD without LMCA With Low CAD burden(i.e., 3 focal stenoses, low SYNTAX score) PCI  A (7)  Indication: 63; Score: 7   Patient Information:   3V-CAD without LMCA With Low CAD burden(i.e., 3 focal stenoses, low SYNTAX score) CABG  A  (9)  Indication: 63; Score: 9   Patient Information:   3V-CAD without LMCA E06c - Intermediate-high CAD burden (i.e., multiple diffuse lesions, presence of CTO, or high SYNTAX score) PCI  U (4)  Indication: 64; Score: 4   Patient Information:   3V-CAD without LMCA E06c - Intermediate-high CAD burden (i.e., multiple diffuse lesions, presence of CTO, or high SYNTAX score) CABG  A (9)  Indication: 64; Score: 9   Patient Information:   LMCA-CAD With Isolated LMCA stenosis  PCI  U (6)  Indication: 65; Score: 6   Patient Information:   LMCA-CAD With Isolated LMCA stenosis  CABG  A (9)  Indication: 65; Score: 9   Patient Information:   LMCA-CAD Additional CAD, low CAD burden (i.e., 1- to 2-vessel additional involvement, low SYNTAX score) PCI  U (5)  Indication: 66; Score: 5   Patient Information:   LMCA-CAD Additional CAD, low CAD burden (i.e., 1- to 2-vessel additional involvement, low SYNTAX score) CABG  A (9)  Indication: 66; Score: 9   Patient Information:   LMCA-CAD Additional CAD, intermediate-high CAD burden (i.e., 3-vessel involvement, presence of CTO, or high SYNTAX score) PCI  I (3)  Indication: 67; Score: 3   Patient Information:   LMCA-CAD Additional CAD, intermediate-high CAD burden (i.e., 3-vessel involvement, presence of CTO, or high SYNTAX score) CABG  A (9)  Indication: 67; Score: 9   Aaron David

## 2017-01-23 NOTE — Discharge Instructions (Signed)
° °  HOLD METFORMIN FOR 48 HOURS. MAY RESUME FRI MORNING.  Radial Site Care Refer to this sheet in the next few weeks. These instructions provide you with information about caring for yourself after your procedure. Your health care provider may also give you more specific instructions. Your treatment has been planned according to current medical practices, but problems sometimes occur. Call your health care provider if you have any problems or questions after your procedure. What can I expect after the procedure? After your procedure, it is typical to have the following:  Bruising at the radial site that usually fades within 1-2 weeks.  Blood collecting in the tissue (hematoma) that may be painful to the touch. It should usually decrease in size and tenderness within 1-2 weeks. Follow these instructions at home:  Take medicines only as directed by your health care provider.  You may shower 24-48 hours after the procedure or as directed by your health care provider. Remove the bandage (dressing) and gently wash the site with plain soap and water. Pat the area dry with a clean towel. Do not rub the site, because this may cause bleeding.  Do not take baths, swim, or use a hot tub until your health care provider approves.  Check your insertion site every day for redness, swelling, or drainage.  Do not apply powder or lotion to the site.  Do not flex or bend the affected arm for 24 hours or as directed by your health care provider.  Do not push or pull heavy objects with the affected arm for 24 hours or as directed by your health care provider.  Do not lift over 10 lb (4.5 kg) for 5 days after your procedure or as directed by your health care provider.  Ask your health care provider when it is okay to:  Return to work or school.  Resume usual physical activities or sports.  Resume sexual activity.  Do not drive home if you are discharged the same day as the procedure. Have someone else  drive you.  You may drive 24 hours after the procedure unless otherwise instructed by your health care provider.  Do not operate machinery or power tools for 24 hours after the procedure.  If your procedure was done as an outpatient procedure, which means that you went home the same day as your procedure, a responsible adult should be with you for the first 24 hours after you arrive home.  Keep all follow-up visits as directed by your health care provider. This is important. Contact a health care provider if:  You have a fever.  You have chills.  You have increased bleeding from the radial site. Hold pressure on the site. Get help right away if:  You have unusual pain at the radial site.  You have redness, warmth, or swelling at the radial site.  You have drainage (other than a small amount of blood on the dressing) from the radial site.  The radial site is bleeding, and the bleeding does not stop after 30 minutes of holding steady pressure on the site.  Your arm or hand becomes pale, cool, tingly, or numb. This information is not intended to replace advice given to you by your health care provider. Make sure you discuss any questions you have with your health care provider. Document Released: 11/11/2010 Document Revised: 03/16/2016 Document Reviewed: 04/27/2014 Elsevier Interactive Patient Education  2017 [employer].

## 2017-01-24 ENCOUNTER — Encounter (HOSPITAL_COMMUNITY): Payer: Self-pay | Admitting: Cardiology

## 2017-01-24 MED FILL — Heparin Sodium (Porcine) 2 Unit/ML in Sodium Chloride 0.9%: INTRAMUSCULAR | Qty: 500 | Status: AC

## 2017-01-24 MED FILL — Nitroglycerin IV Soln 200 MCG/ML in D5W: INTRAVENOUS | Qty: 250 | Status: AC

## 2017-02-05 DIAGNOSIS — Z7984 Long term (current) use of oral hypoglycemic drugs: Secondary | ICD-10-CM | POA: Diagnosis not present

## 2017-02-05 DIAGNOSIS — E782 Mixed hyperlipidemia: Secondary | ICD-10-CM | POA: Diagnosis not present

## 2017-02-05 DIAGNOSIS — Z0000 Encounter for general adult medical examination without abnormal findings: Secondary | ICD-10-CM | POA: Diagnosis not present

## 2017-02-05 DIAGNOSIS — Z23 Encounter for immunization: Secondary | ICD-10-CM | POA: Diagnosis not present

## 2017-02-05 DIAGNOSIS — N400 Enlarged prostate without lower urinary tract symptoms: Secondary | ICD-10-CM | POA: Diagnosis not present

## 2017-02-05 DIAGNOSIS — E559 Vitamin D deficiency, unspecified: Secondary | ICD-10-CM | POA: Diagnosis not present

## 2017-02-05 DIAGNOSIS — E119 Type 2 diabetes mellitus without complications: Secondary | ICD-10-CM | POA: Diagnosis not present

## 2017-02-05 DIAGNOSIS — Z125 Encounter for screening for malignant neoplasm of prostate: Secondary | ICD-10-CM | POA: Diagnosis not present

## 2017-02-05 DIAGNOSIS — I10 Essential (primary) hypertension: Secondary | ICD-10-CM | POA: Diagnosis not present

## 2017-03-16 DIAGNOSIS — J020 Streptococcal pharyngitis: Secondary | ICD-10-CM | POA: Diagnosis not present

## 2017-03-29 DIAGNOSIS — J3089 Other allergic rhinitis: Secondary | ICD-10-CM | POA: Diagnosis not present

## 2017-03-29 DIAGNOSIS — J301 Allergic rhinitis due to pollen: Secondary | ICD-10-CM | POA: Diagnosis not present

## 2017-04-04 DIAGNOSIS — J3089 Other allergic rhinitis: Secondary | ICD-10-CM | POA: Diagnosis not present

## 2017-04-04 DIAGNOSIS — J301 Allergic rhinitis due to pollen: Secondary | ICD-10-CM | POA: Diagnosis not present

## 2017-04-12 DIAGNOSIS — J301 Allergic rhinitis due to pollen: Secondary | ICD-10-CM | POA: Diagnosis not present

## 2017-04-12 DIAGNOSIS — J3089 Other allergic rhinitis: Secondary | ICD-10-CM | POA: Diagnosis not present

## 2017-04-18 DIAGNOSIS — J3089 Other allergic rhinitis: Secondary | ICD-10-CM | POA: Diagnosis not present

## 2017-04-18 DIAGNOSIS — J301 Allergic rhinitis due to pollen: Secondary | ICD-10-CM | POA: Diagnosis not present

## 2017-04-27 DIAGNOSIS — J301 Allergic rhinitis due to pollen: Secondary | ICD-10-CM | POA: Diagnosis not present

## 2017-04-27 DIAGNOSIS — J3089 Other allergic rhinitis: Secondary | ICD-10-CM | POA: Diagnosis not present

## 2017-05-02 DIAGNOSIS — J3089 Other allergic rhinitis: Secondary | ICD-10-CM | POA: Diagnosis not present

## 2017-05-02 DIAGNOSIS — J301 Allergic rhinitis due to pollen: Secondary | ICD-10-CM | POA: Diagnosis not present

## 2017-05-23 DIAGNOSIS — J3089 Other allergic rhinitis: Secondary | ICD-10-CM | POA: Diagnosis not present

## 2017-05-23 DIAGNOSIS — J301 Allergic rhinitis due to pollen: Secondary | ICD-10-CM | POA: Diagnosis not present

## 2017-07-09 DIAGNOSIS — F419 Anxiety disorder, unspecified: Secondary | ICD-10-CM | POA: Diagnosis not present

## 2017-07-24 DIAGNOSIS — K5730 Diverticulosis of large intestine without perforation or abscess without bleeding: Secondary | ICD-10-CM | POA: Diagnosis not present

## 2017-07-24 DIAGNOSIS — R194 Change in bowel habit: Secondary | ICD-10-CM | POA: Diagnosis not present

## 2017-07-24 DIAGNOSIS — R142 Eructation: Secondary | ICD-10-CM | POA: Diagnosis not present

## 2017-07-24 DIAGNOSIS — K219 Gastro-esophageal reflux disease without esophagitis: Secondary | ICD-10-CM | POA: Diagnosis not present

## 2017-07-24 DIAGNOSIS — R140 Abdominal distension (gaseous): Secondary | ICD-10-CM | POA: Diagnosis not present

## 2017-08-07 DIAGNOSIS — K5730 Diverticulosis of large intestine without perforation or abscess without bleeding: Secondary | ICD-10-CM | POA: Diagnosis not present

## 2017-08-07 DIAGNOSIS — R142 Eructation: Secondary | ICD-10-CM | POA: Diagnosis not present

## 2017-08-07 DIAGNOSIS — Z86010 Personal history of colonic polyps: Secondary | ICD-10-CM | POA: Diagnosis not present

## 2017-08-07 DIAGNOSIS — K219 Gastro-esophageal reflux disease without esophagitis: Secondary | ICD-10-CM | POA: Diagnosis not present

## 2017-09-25 DIAGNOSIS — K219 Gastro-esophageal reflux disease without esophagitis: Secondary | ICD-10-CM | POA: Diagnosis not present

## 2017-09-25 DIAGNOSIS — R142 Eructation: Secondary | ICD-10-CM | POA: Diagnosis not present

## 2017-09-25 DIAGNOSIS — K5730 Diverticulosis of large intestine without perforation or abscess without bleeding: Secondary | ICD-10-CM | POA: Diagnosis not present

## 2017-10-10 DIAGNOSIS — I10 Essential (primary) hypertension: Secondary | ICD-10-CM | POA: Diagnosis not present

## 2017-10-10 DIAGNOSIS — R0602 Shortness of breath: Secondary | ICD-10-CM | POA: Diagnosis not present

## 2017-10-10 DIAGNOSIS — I209 Angina pectoris, unspecified: Secondary | ICD-10-CM | POA: Diagnosis not present

## 2017-10-10 DIAGNOSIS — K219 Gastro-esophageal reflux disease without esophagitis: Secondary | ICD-10-CM | POA: Diagnosis not present

## 2017-10-12 DIAGNOSIS — E782 Mixed hyperlipidemia: Secondary | ICD-10-CM | POA: Diagnosis not present

## 2017-10-12 DIAGNOSIS — I10 Essential (primary) hypertension: Secondary | ICD-10-CM | POA: Diagnosis not present

## 2017-10-12 DIAGNOSIS — E559 Vitamin D deficiency, unspecified: Secondary | ICD-10-CM | POA: Diagnosis not present

## 2017-10-12 DIAGNOSIS — E119 Type 2 diabetes mellitus without complications: Secondary | ICD-10-CM | POA: Diagnosis not present

## 2017-11-27 DIAGNOSIS — J029 Acute pharyngitis, unspecified: Secondary | ICD-10-CM | POA: Diagnosis not present

## 2017-11-27 DIAGNOSIS — R509 Fever, unspecified: Secondary | ICD-10-CM | POA: Diagnosis not present

## 2017-11-27 DIAGNOSIS — J0100 Acute maxillary sinusitis, unspecified: Secondary | ICD-10-CM | POA: Diagnosis not present

## 2018-01-24 DIAGNOSIS — H2513 Age-related nuclear cataract, bilateral: Secondary | ICD-10-CM | POA: Diagnosis not present

## 2018-01-24 DIAGNOSIS — E119 Type 2 diabetes mellitus without complications: Secondary | ICD-10-CM | POA: Diagnosis not present

## 2018-01-24 DIAGNOSIS — H40003 Preglaucoma, unspecified, bilateral: Secondary | ICD-10-CM | POA: Diagnosis not present

## 2018-01-24 DIAGNOSIS — H04123 Dry eye syndrome of bilateral lacrimal glands: Secondary | ICD-10-CM | POA: Diagnosis not present

## 2018-02-04 DIAGNOSIS — M25572 Pain in left ankle and joints of left foot: Secondary | ICD-10-CM | POA: Diagnosis not present

## 2018-02-04 DIAGNOSIS — S83242D Other tear of medial meniscus, current injury, left knee, subsequent encounter: Secondary | ICD-10-CM | POA: Diagnosis not present

## 2018-02-12 DIAGNOSIS — E782 Mixed hyperlipidemia: Secondary | ICD-10-CM | POA: Diagnosis not present

## 2018-02-12 DIAGNOSIS — I10 Essential (primary) hypertension: Secondary | ICD-10-CM | POA: Diagnosis not present

## 2018-02-12 DIAGNOSIS — E119 Type 2 diabetes mellitus without complications: Secondary | ICD-10-CM | POA: Diagnosis not present

## 2018-02-12 DIAGNOSIS — E559 Vitamin D deficiency, unspecified: Secondary | ICD-10-CM | POA: Diagnosis not present

## 2018-02-13 DIAGNOSIS — Z23 Encounter for immunization: Secondary | ICD-10-CM | POA: Diagnosis not present

## 2018-02-13 DIAGNOSIS — Z125 Encounter for screening for malignant neoplasm of prostate: Secondary | ICD-10-CM | POA: Diagnosis not present

## 2018-02-13 DIAGNOSIS — Z0000 Encounter for general adult medical examination without abnormal findings: Secondary | ICD-10-CM | POA: Diagnosis not present

## 2018-08-26 DIAGNOSIS — E119 Type 2 diabetes mellitus without complications: Secondary | ICD-10-CM | POA: Diagnosis not present

## 2018-08-26 DIAGNOSIS — J0100 Acute maxillary sinusitis, unspecified: Secondary | ICD-10-CM | POA: Diagnosis not present

## 2018-08-26 DIAGNOSIS — R05 Cough: Secondary | ICD-10-CM | POA: Diagnosis not present

## 2018-09-09 DIAGNOSIS — E119 Type 2 diabetes mellitus without complications: Secondary | ICD-10-CM | POA: Diagnosis not present

## 2018-12-09 ENCOUNTER — Other Ambulatory Visit: Payer: Self-pay

## 2018-12-09 MED ORDER — LISINOPRIL 20 MG PO TABS: 20.0000 mg | ORAL_TABLET | Freq: Every day | ORAL | 3 refills | Status: DC

## 2019-04-28 ENCOUNTER — Ambulatory Visit: Payer: Self-pay | Admitting: Cardiology

## 2019-06-06 ENCOUNTER — Encounter: Payer: Self-pay | Admitting: Cardiology

## 2019-06-10 ENCOUNTER — Other Ambulatory Visit: Payer: Self-pay

## 2019-06-10 ENCOUNTER — Encounter: Payer: Self-pay | Admitting: Cardiology

## 2019-06-10 ENCOUNTER — Ambulatory Visit (INDEPENDENT_AMBULATORY_CARE_PROVIDER_SITE_OTHER): Payer: Medicare Other | Admitting: Cardiology

## 2019-06-10 VITALS — BP 150/85 | HR 72 | Ht 68.0 in | Wt 127.7 lb

## 2019-06-10 DIAGNOSIS — E7800 Pure hypercholesterolemia, unspecified: Secondary | ICD-10-CM

## 2019-06-10 DIAGNOSIS — I10 Essential (primary) hypertension: Secondary | ICD-10-CM | POA: Diagnosis not present

## 2019-06-10 DIAGNOSIS — I209 Angina pectoris, unspecified: Secondary | ICD-10-CM

## 2019-06-10 DIAGNOSIS — Z5181 Encounter for therapeutic drug level monitoring: Secondary | ICD-10-CM

## 2019-06-10 DIAGNOSIS — E118 Type 2 diabetes mellitus with unspecified complications: Secondary | ICD-10-CM | POA: Diagnosis not present

## 2019-06-10 MED ORDER — METFORMIN HCL ER 500 MG PO TB24
500.0000 mg | ORAL_TABLET | Freq: Every day | ORAL | Status: DC
Start: 2019-06-10 — End: 2019-06-10

## 2019-06-10 MED ORDER — OLMESARTAN MEDOXOMIL 40 MG PO TABS: 40.0000 mg | ORAL_TABLET | Freq: Every evening | ORAL | 2 refills | Status: DC

## 2019-06-10 MED ORDER — ATORVASTATIN CALCIUM 10 MG PO TABS
10.0000 mg | ORAL_TABLET | Freq: Every day | ORAL | 3 refills | Status: DC
Start: 2019-06-10 — End: 2020-08-12

## 2019-06-10 NOTE — Patient Instructions (Signed)
Get blood work in 2 weeks. And again in 6 weeks I will see you back in 2 months.

## 2019-06-10 NOTE — Progress Notes (Signed)
Primary Physician/Referring:  Maurice Small, MD  Patient ID: Aaron David, male    DOB: Feb 12, 1951, 68 y.o.   MRN: [id number]  Chief Complaint  Patient presents with  . Atrial Fibrillation  . Follow-up    6 month   HPI:    Aaron David  is a 68 y.o. Asian [place] male with moderate coronary artery disease by angiogram on 01/23/2017, exertionalll chest pain, GERD presents for 6 month f/u. He states that he is been doing well and has not used sublingual nitroglycerin in the past 6 months.   He still continues to have chest pain with radiation to his neck but easily relieved with rest.  It is associated with mild dyspnea.  He also has GERD symptoms and takes PPI on a p.r.n. basis.  Otherwise no new symptoms.  He brings his labs from his PCP.  Past Medical History:  Diagnosis Date  . Acid reflux   . Borderline diabetes   . Headache    Past Surgical History:  Procedure Laterality Date  . LEFT HEART CATH AND CORONARY ANGIOGRAPHY N/A 01/23/2017   Procedure: Left Heart Cath and Coronary Angiography;  Surgeon: Adrian Prows, MD;  Location: [facility];  Service: Cardiovascular;  Laterality: N/A;   Social History   Socioeconomic History  . Marital status: Married    Spouse name: [name]  . Number of children: 2  . Years of education: 72  . Highest education level: Not on file  Occupational History    Comment: [employer]  Social Needs  . Financial resource strain: Not on file  . Food insecurity    Worry: Not on file    Inability: Not on file  . Transportation needs    Medical: Not on file    Non-medical: Not on file  Tobacco Use  . Smoking status: Never Smoker  . Smokeless tobacco: Never Used  Substance and Sexual Activity  . Alcohol use: No    Alcohol/week: 0.0 standard drinks  . Drug use: No  . Sexual activity: Not on file  Lifestyle  . Physical activity    Days per week: Not on file    Minutes per session: Not on file  . Stress: Not on file   Relationships  . Social Herbalist on phone: Not on file    Gets together: Not on file    Attends religious service: Not on file    Active member of club or organization: Not on file    Attends meetings of clubs or organizations: Not on file    Relationship status: Not on file  . Intimate partner violence    Fear of current or ex partner: Not on file    Emotionally abused: Not on file    Physically abused: Not on file    Forced sexual activity: Not on file  Other Topics Concern  . Not on file  Social History Narrative   Lives at home with wife, [name]   Caffeine use- coffee, tea 1 cup daily   ROS  Review of Systems  Constitution: Negative for chills, decreased appetite, malaise/fatigue and weight gain.  Cardiovascular: Positive for chest pain. Negative for dyspnea on exertion, leg swelling and syncope.  Respiratory: Positive for shortness of breath.   Endocrine: Negative for cold intolerance.  Hematologic/Lymphatic: Does not bruise/bleed easily.  Musculoskeletal: Negative for joint swelling.  Gastrointestinal: Positive for heartburn. Negative for abdominal pain, anorexia, change in bowel habit, hematochezia and melena.  Neurological:  Negative for headaches and light-headedness.  Psychiatric/Behavioral: Negative for depression and substance abuse.  All other systems reviewed and are negative.  Objective  Blood pressure (!) 150/85, pulse 72, height _0  (1.727 m), weight 127 lb 11.2 oz (57.9 kg), SpO2 99 %. Body mass index is 19.42 kg/m.   Physical Exam  Constitutional: He appears well-developed and well-nourished. No distress.  HENT:  Head: Atraumatic.  Eyes: Conjunctivae are normal.  Neck: Neck supple. No JVD present. No thyromegaly present.  Cardiovascular: Normal rate, regular rhythm, normal heart sounds and intact distal pulses. Exam reveals no gallop.  No murmur heard. Pulmonary/Chest: Effort normal and breath sounds normal.  Abdominal: Soft. Bowel sounds  are normal.  Musculoskeletal: Normal range of motion.  Neurological: He is alert.  Skin: Skin is warm and dry.  Psychiatric: He has a normal mood and affect.   Radiology: No results found.  Laboratory examination:   Labs 05/22/2019: Serum glucose 131 mg, A1c 6.2%.  BUN 13, creatinine 0.90, eGFR greater than 60 mL, potassium 4.4, CMP normal.  Vitamin D normal, total cholesterol 172, triglycerides 83, HDL 40, LDL 115.  Non-HDL cholesterol 131.  PSA normal.  08/26/2018: Hemoglobin A1c 6.5%.  Vitamin D 66.4.  Cholesterol 185, triglycerides 123, HDL 36, LDL 125.  No results for input(s): NA, K, CL, CO2, GLUCOSE, BUN, CREATININE, CALCIUM, GFRNONAA, GFRAA in the last 8760 hours. CMP Latest Ref Rng & Units 01/23/2017  Glucose 65 - 99 mg/dL 131(H)  BUN 6 - 20 mg/dL 7  Creatinine 0.61 - 1.24 mg/dL 0.86  Sodium 135 - 145 mmol/L 141  Potassium 3.5 - 5.1 mmol/L 4.0  Chloride 101 - 111 mmol/L 108  CO2 22 - 32 mmol/L 27  Calcium 8.9 - 10.3 mg/dL 8.5(L)   CBC Latest Ref Rng & Units 01/23/2017  WBC 4.0 - 10.5 K/uL 7.8  Hemoglobin 13.0 - 17.0 g/dL 14.1  Hematocrit 39.0 - 52.0 % 41.4  Platelets 150 - 400 K/uL 141(L)   Lipid Panel  No results found for: CHOL, TRIG, HDL, CHOLHDL, VLDL, LDLCALC, LDLDIRECT HEMOGLOBIN A1C No results found for: HGBA1C, MPG TSH No results for input(s): TSH in the last 8760 hours. Medications   Prior to Admission medications   Medication Sig Start Date End Date Taking? Authorizing Provider  Cyanocobalamin (VITAMIN B 12 PO) Take 1 tablet by mouth daily.   Yes [provider]  VITAMIN D PO Take 1,000 Units by mouth daily.   Yes [provider]  albuterol (PROVENTIL HFA;VENTOLIN HFA) 108 (90 BASE) MCG/ACT inhaler Inhale 2 puffs into the lungs every 6 (six) hours as needed for wheezing or shortness of breath.    [provider]  aspirin 81 MG tablet Take 81 mg by mouth daily.    [provider]  atorvastatin (LIPITOR) 40 MG tablet Take  40 mg by mouth daily at 6 PM.  07/07/15   [provider]  Azelastine-Fluticasone (DYMISTA) 137-50 MCG/ACT SUSP Place 1 spray into the nose 2 (two) times daily before a meal.     [provider]  carvedilol (COREG) 6.25 MG tablet Take 6.25 mg by mouth 2 (two) times daily.  07/27/15   [provider]  EPINEPHrine 0.3 mg/0.3 mL IJ SOAJ injection Inject 0.3 mg into the muscle once as needed. 12/08/16   [provider]  lisinopril (PRINIVIL,ZESTRIL) 20 MG tablet Take 1 tablet (20 mg total) by mouth daily. 12/09/18 03/09/19  Adrian Prows, MD  metFORMIN (GLUCOPHAGE-XR) 500 MG 24 hr tablet Take  1 tablet (500 mg total) by mouth daily with breakfast. 01/25/17   Adrian Prows, MD  montelukast (SINGULAIR) 10 MG tablet Take 10 mg by mouth at bedtime.  06/02/15   [provider]  Multiple Vitamin (MULTIVITAMIN) tablet Take 1 tablet by mouth daily.    [provider]  omeprazole (PRILOSEC) 40 MG capsule Take 40 mg by mouth daily.    [provider]     Current Outpatient Medications  Medication Instructions  . albuterol (PROVENTIL HFA;VENTOLIN HFA) 108 (90 BASE) MCG/ACT inhaler 2 puffs, Inhalation, Every 6 hours PRN  . aspirin 81 mg, Oral, Daily  . atorvastatin (LIPITOR) 40 mg, Oral, Daily-1800  . Azelastine-Fluticasone (DYMISTA) 137-50 MCG/ACT SUSP 1 spray, Nasal, 2 times daily before meals  . carvedilol (COREG) 6.25 mg, Oral, 2 times daily  . Cyanocobalamin (VITAMIN B 12 PO) 1 tablet, Oral, Daily  . EPINEPHrine (EPI-PEN) 0.3 mg, Intramuscular, Once PRN  . lisinopril (ZESTRIL) 20 mg, Oral, Daily  . metFORMIN (GLUCOPHAGE-XR) 500 mg, Oral, Daily with breakfast  . montelukast (SINGULAIR) 10 mg, Oral, Daily at bedtime  . Multiple Vitamin (MULTIVITAMIN) tablet 1 tablet, Oral, Daily  . omeprazole (PRILOSEC) 40 mg, Oral, Daily  . VITAMIN D PO 1,000 Units, Oral, Daily    Cardiac Studies:   Nuclear stress test [10/21/2014]:  1. The resting electrocardiogram  demonstrated normal sinus rhythm, normal resting conduction, no resting arrhythmias and normal rest repolarization. The stress electrocardiogram was at most equivocal for ischemia. There was 1 mm upsloping ST depression with exercise, back to baseline into recovery. Stress test terminated due to fatigue. Normal blood pressure response. The patient completed an estimated workload of 7.4 METS. 2. Myocardial perfusion imaging is normal. Overall left ventricular systolic function was normal without regional wall motion abnormalities. The left ventricular ejection fraction was 52%.  Cardiac cath 01/23/2017: Proximal LAD 30%, ostial ramus 40%, mid RCA mild disease. Normal LVEF.   Assessment     ICD-10-CM   1. Angina pectoris ([place])  I20.9 EKG 12-Lead  2. Hypercholesteremia  E78.00 EKG 12-Lead  3. Essential hypertension  I10 EKG 12-Lead    EKG 06/10/2019: Normal sinus rhythm with rate of 74 bpm, normal axis.  No evidence of ischemia, normal EKG. No significant change from  EKG 11/07/2018   Recommendations:   Patient still continues to have exertional chest pain with radiation to the neck but relieved immediately with testing for 3-4 minutes.  He has not used any sublingual nitroglycerin.  After review of his labs, we discussed regarding hyperlipidemia, patient has been avoiding taking atorvastatin due to myalgias and arthralgias for the last 5 months he has not taken any dosage.  I would like to start him at 10 mg once a day and see whether she would tolerate this.  Will repeat LFTs and lipids in 6-8 weeks.  Blood pressure also elevated both here and at home, discontinued lisinopril and switched him to Benicar 40 mg, he will try one half tablet daily for a week and if blood pressure is not controlled to go to 40 mg in the evening daily.  I'll obtain a BMP in 2 weeks.  I'd like to see him back in 2 months for follow-up.  Adrian Prows, MD, [facility] 06/10/2019, 3:20 PM [facility]. [place] Pager:  [phone] Office: [phone] If no answer Cell [phone]

## 2019-07-05 LAB — HEPATIC FUNCTION PANEL
ALT: 19 IU/L (ref 0–44)
AST: 22 IU/L (ref 0–40)
Albumin: 4.1 g/dL (ref 3.8–4.8)
Alkaline Phosphatase: 51 IU/L (ref 39–117)
Bilirubin Total: 0.4 mg/dL (ref 0.0–1.2)
Bilirubin, Direct: 0.14 mg/dL (ref 0.00–0.40)
Total Protein: 6.2 g/dL (ref 6.0–8.5)

## 2019-07-05 LAB — LIPID PANEL WITH LDL/HDL RATIO
Cholesterol, Total: 123 mg/dL (ref 100–199)
HDL: 43 mg/dL (ref >39)
LDL Chol Calc (NIH): 65 mg/dL (ref 0–99)
LDL/HDL Ratio: 1.5 ratio (ref 0.0–3.6)
Triglycerides: 72 mg/dL (ref 0–149)
VLDL Cholesterol Cal: 15 mg/dL (ref 5–40)

## 2019-08-08 LAB — BASIC METABOLIC PANEL
BUN/Creatinine Ratio: 15 (ref 10–24)
BUN: 13 mg/dL (ref 8–27)
CO2: 26 mmol/L (ref 20–29)
Calcium: 9.2 mg/dL (ref 8.6–10.2)
Chloride: 104 mmol/L (ref 96–106)
Creatinine, Ser: 0.89 mg/dL (ref 0.76–1.27)
GFR calc Af Amer: 102 mL/min/{1.73_m2} (ref >59)
GFR calc non Af Amer: 88 mL/min/{1.73_m2} (ref >59)
Glucose: 159 mg/dL — ABNORMAL HIGH (ref 65–99)
Potassium: 4.9 mmol/L (ref 3.5–5.2)
Sodium: 141 mmol/L (ref 134–144)

## 2019-08-11 ENCOUNTER — Ambulatory Visit (INDEPENDENT_AMBULATORY_CARE_PROVIDER_SITE_OTHER): Payer: Medicare Other | Admitting: Cardiology

## 2019-08-11 ENCOUNTER — Other Ambulatory Visit: Payer: Self-pay

## 2019-08-11 ENCOUNTER — Encounter: Payer: Self-pay | Admitting: Cardiology

## 2019-08-11 VITALS — BP 147/78 | HR 75 | Temp 96.1°F | Ht 68.0 in | Wt 128.4 lb

## 2019-08-11 DIAGNOSIS — I10 Essential (primary) hypertension: Secondary | ICD-10-CM

## 2019-08-11 DIAGNOSIS — E7800 Pure hypercholesterolemia, unspecified: Secondary | ICD-10-CM | POA: Diagnosis not present

## 2019-08-11 DIAGNOSIS — I209 Angina pectoris, unspecified: Secondary | ICD-10-CM | POA: Diagnosis not present

## 2019-08-11 NOTE — Progress Notes (Signed)
Primary Physician/Referring:  Maurice Small, MD  Patient ID: Aaron David, male    DOB: 10-30-1950, 68 y.o.   MRN: [id number]  Chief Complaint  Patient presents with  . Hypertension  . Hyperlipidemia  . Results    labs  . Follow-up    7mo  HPI:    Aaron David is a 68y.o. [place] with moderate coronary artery disease by angiogram on 01/23/2017, exertionalll chest pain, GERD presents for 2 month f/u. I 2 months ago.  Blood pressure was elevated, I discontinued lisinopril and switched him to Benicar.  He initially blood pressure was well controlled on 40 mg, his reduce the dose to 20 mg once a day.  He is now taking statins regularly.  Over the past 2 months states that he has not had any anginal episodes.  Past Medical History:  Diagnosis Date  . Acid reflux   . Borderline diabetes   . Headache    Past Surgical History:  Procedure Laterality Date  . LEFT HEART CATH AND CORONARY ANGIOGRAPHY N/A 01/23/2017   Procedure: Left Heart Cath and Coronary Angiography;  Surgeon: JAdrian Prows MD;  [facility];  Service: Cardiovascular;  Laterality: N/A;   Social History   Socioeconomic History  . Marital status: Married    Spouse name: [name]  . Number of children: 2  . Years of education: 143 . Highest education level: Not on file  Occupational History    Comment: [employer]  Social Needs  . Financial resource strain: Not on file  . Food insecurity    Worry: Not on file    Inability: Not on file  . Transportation needs    Medical: Not on file    Non-medical: Not on file  Tobacco Use  . Smoking status: Never Smoker  . Smokeless tobacco: Never Used  Substance and Sexual Activity  . Alcohol use: No    Alcohol/week: 0.0 standard drinks  . Drug use: No  . Sexual activity: Not on file  Lifestyle  . Physical activity    Days per week: Not on file    Minutes per session: Not on file  . Stress: Not on file  Relationships  . Social  [place]bal[place] phone: Not on file    Gets together: Not on file    Attends religious service: Not on file    Active member of club or organization: Not on file    Attends meetings of clubs or organizations: Not on file    Relationship status: Not on file  . Intimate partner violence    Fear of current or ex partner: Not on file    Emotionally abused: Not on file    Physically abused: Not on file    Forced sexual activity: Not on file  Other Topics Concern  . Not on file  Social History Narrative   Lives at home with wife, [name]   Caffeine use- coffee, tea 1 cup daily   ROS  Review of Systems  Constitution: Negative for chills, decreased appetite, malaise/fatigue and weight gain.  Cardiovascular: Positive for dyspnea on exertion (mild and stable). Negative for chest pain, leg swelling and syncope.  Endocrine: Negative for cold intolerance.  Hematologic/Lymphatic: Does not bruise/bleed easily.  Musculoskeletal: Negative for joint swelling.  Gastrointestinal: Positive for heartburn. Negative for abdominal pain, anorexia, change in bowel habit, hematochezia and melena.  Neurological: Negative for headaches and light-headedness.  Psychiatric/Behavioral: Negative  for depression and substance abuse.  All other systems reviewed and are negative.  Objective   Vitals with BMI 08/11/2019 06/10/2019 01/23/2017  Height _0  _1  -  Weight 128 lbs 6 oz 127 lbs 11 oz -  BMI 75.10 25.85 -  Systolic 277 824 235  Diastolic 78 85 73  Pulse 75 72 79    Physical Exam  Constitutional: He appears well-developed and well-nourished. No distress.  HENT:  Head: Atraumatic.  Eyes: Conjunctivae are normal.  Neck: Neck supple. No JVD present. No thyromegaly present.  Cardiovascular: Normal rate, regular rhythm, normal heart sounds and intact distal pulses. Exam reveals no gallop.  No murmur heard. Pulmonary/Chest: Effort normal and breath sounds normal.  Abdominal: Soft. Bowel sounds are  normal.  Musculoskeletal: Normal range of motion.  Neurological: He is alert.  Skin: Skin is warm and dry.  Psychiatric: He has a normal mood and affect.   Radiology: No results found.  Laboratory examination:   Labs 05/22/2019: Serum glucose 131 mg, A1c 6.2%.  BUN 13, creatinine 0.90, eGFR greater than 60 mL, potassium 4.4, CMP normal.  Vitamin D normal, total cholesterol 172, triglycerides 83, HDL 40, LDL 115.  Non-HDL cholesterol 131.  PSA normal.  08/26/2018: Hemoglobin A1c 6.5%.  Vitamin D 66.4.  Cholesterol 185, triglycerides 123, HDL 36, LDL 125.  Recent Labs    08/07/19 1351  NA 141  K 4.9  CL 104  CO2 26  GLUCOSE 159*  BUN 13  CREATININE 0.89  CALCIUM 9.2  GFRNONAA 88  GFRAA 102   CMP Latest Ref Rng & Units 08/07/2019 07/04/2019 01/23/2017  Glucose 65 - 99 mg/dL 159(H) - 131(H)  BUN 8 - 27 mg/dL 13 - 7  Creatinine 0.76 - 1.27 mg/dL 0.89 - 0.86  Sodium 134 - 144 mmol/L 141 - 141  Potassium 3.5 - 5.2 mmol/L 4.9 - 4.0  Chloride 96 - 106 mmol/L 104 - 108  CO2 20 - 29 mmol/L 26 - 27  Calcium 8.6 - 10.2 mg/dL 9.2 - 8.5(L)  Total Protein 6.0 - 8.5 g/dL - 6.2 -  Total Bilirubin 0.0 - 1.2 mg/dL - 0.4 -  Alkaline Phos 39 - 117 IU/L - 51 -  AST 0 - 40 IU/L - 22 -  ALT 0 - 44 IU/L - 19 -   CBC Latest Ref Rng & Units 01/23/2017  WBC 4.0 - 10.5 K/uL 7.8  Hemoglobin 13.0 - 17.0 g/dL 14.1  Hematocrit 39.0 - 52.0 % 41.4  Platelets 150 - 400 K/uL 141(L)   Lipid Panel     Component Value Date/Time   CHOL 123 07/04/2019 0904   TRIG 72 07/04/2019 0904   HDL 43 07/04/2019 0904   LDLCALC 65 07/04/2019 0904   HEMOGLOBIN A1C No results found for: HGBA1C, MPG TSH No results for input(s): TSH in the last 8760 hours. Medications   Prior to Admission medications   Medication Sig Start Date End Date Taking? Authorizing Provider  Cyanocobalamin (VITAMIN B 12 PO) Take 1 tablet by mouth daily.   Yes [provider]  VITAMIN D PO Take 1,000 Units by mouth daily.   Yes  [provider]  albuterol (PROVENTIL HFA;VENTOLIN HFA) 108 (90 BASE) MCG/ACT inhaler Inhale 2 puffs into the lungs every 6 (six) hours as needed for wheezing or shortness of breath.    [provider]  aspirin 81 MG tablet Take 81 mg by mouth daily.    [provider]  atorvastatin (LIPITOR) 40 MG  tablet Take 40 mg by mouth daily at 6 PM.  07/07/15   [provider]  Azelastine-Fluticasone (DYMISTA) 137-50 MCG/ACT SUSP Place 1 spray into the nose 2 (two) times daily before a meal.     [provider]  carvedilol (COREG) 6.25 MG tablet Take 6.25 mg by mouth 2 (two) times daily.  07/27/15   [provider]  EPINEPHrine 0.3 mg/0.3 mL IJ SOAJ injection Inject 0.3 mg into the muscle once as needed. 12/08/16   [provider]  lisinopril (PRINIVIL,ZESTRIL) 20 MG tablet Take 1 tablet (20 mg total) by mouth daily. 12/09/18 03/09/19  Adrian Prows, MD  metFORMIN (GLUCOPHAGE-XR) 500 MG 24 hr tablet Take 1 tablet (500 mg total) by mouth daily with breakfast. 01/25/17   Adrian Prows, MD  montelukast (SINGULAIR) 10 MG tablet Take 10 mg by mouth at bedtime.  06/02/15   [provider]  Multiple Vitamin (MULTIVITAMIN) tablet Take 1 tablet by mouth daily.    [provider]  omeprazole (PRILOSEC) 40 MG capsule Take 40 mg by mouth daily.    [provider]     Current Outpatient Medications  Medication Instructions  . albuterol (PROVENTIL HFA;VENTOLIN HFA) 108 (90 BASE) MCG/ACT inhaler 2 puffs, Inhalation, Every 6 hours PRN  . aspirin 81 mg, Oral, Daily  . atorvastatin (LIPITOR) 10 mg, Oral, Daily-1800  . Azelastine-Fluticasone (DYMISTA) 137-50 MCG/ACT SUSP 1 spray, Nasal, 2 times daily before meals  . carvedilol (COREG) 6.25 mg, Oral, 2 times daily  . Cyanocobalamin (VITAMIN B 12 PO) 1 tablet, Oral, Daily  . EPINEPHrine (EPI-PEN) 0.3 mg, Intramuscular, Once PRN  . metFORMIN (GLUCOPHAGE) 250 mg, Oral, 2 times daily with meals  .  montelukast (SINGULAIR) 10 mg, Oral, Daily at bedtime  . Multiple Vitamin (MULTIVITAMIN) tablet 1 tablet, Oral, Daily  . olmesartan (BENICAR) 40 mg, Oral, Every evening  . pantoprazole (PROTONIX) 40 MG tablet Daily  . VITAMIN D PO 1,000 Units, Oral, Daily    Cardiac Studies:   Nuclear stress test [10/21/2014]:  1. The resting electrocardiogram demonstrated normal sinus rhythm, normal resting conduction, no resting arrhythmias and normal rest repolarization. The stress electrocardiogram was at most equivocal for ischemia. There was 1 mm upsloping ST depression with exercise, back to baseline into recovery. Stress test terminated due to fatigue. Normal blood pressure response. The patient completed an estimated workload of 7.4 METS. 2. Myocardial perfusion imaging is normal. Overall left ventricular systolic function was normal without regional wall motion abnormalities. The left ventricular ejection fraction was 52%.  Cardiac cath 01/23/2017: Proximal LAD 30%, ostial ramus 40%, mid RCA mild disease. Normal LVEF.  Assessment     ICD-10-CM   1. Essential hypertension  I10   2. Hypercholesteremia  E78.00   3. Angina pectoris ([place])  I20.9     EKG 06/10/2019: Normal sinus rhythm with rate of 74 bpm, normal axis.  No evidence of ischemia, normal EKG. No significant change from  EKG 11/07/2018   Recommendations:   Patient is here on a two-month office visit and follow-up of hypertension, angina pectoris and also hyperlipidemia.  Patient is presently doing well and fortunately was the past 2 months he has not had any further anginal episodes.  His blood pressure still is slightly elevated, I will start her on Benicar 40 mg previously but he has been only taking 20 mg once a day, with 40 mg dose he did say that his blood pressure was well-controlled hence advised him to increase the dose.  He  is now been compliant with his statins, LDL has normalized.  At this request I'll see him back in 6  months. Adrian Prows, MD, [facility] 08/11/2019, 11:55 AM [facility]. [place] Pager: [phone] Office: [phone] If no answer Cell [phone]

## 2019-10-21 ENCOUNTER — Telehealth: Payer: Self-pay

## 2019-10-21 ENCOUNTER — Other Ambulatory Visit: Payer: Self-pay

## 2019-10-21 NOTE — Telephone Encounter (Signed)
Pt pharmacy called and wants to know if I can refill lisinopril 20 mg and I dont see it in his chart nor do I see it in last ov, can you help me

## 2019-10-21 NOTE — Telephone Encounter (Signed)
Looks like Lisinopril was discontinued and changed to Benicar at his last office visit

## 2019-10-22 NOTE — Telephone Encounter (Signed)
Pharmacy aware

## 2019-11-13 ENCOUNTER — Ambulatory Visit: Payer: Medicare Other | Attending: Internal Medicine

## 2019-11-13 DIAGNOSIS — Z23 Encounter for immunization: Secondary | ICD-10-CM | POA: Insufficient documentation

## 2019-11-13 NOTE — Progress Notes (Signed)
   Covid-19 Vaccination Clinic  Name:  Aaron David    MRN: [id number] DOB: 02/16/1951  11/13/2019  [name] was observed post Covid-19 immunization for 15 minutes without incidence. He was provided with Vaccine Information Sheet and instruction to access the V-Safe system.   [name] was instructed to call 911 with any severe reactions post vaccine: Marland Kitchen Difficulty breathing  . Swelling of your face and throat  . A fast heartbeat  . A bad rash all over your body  . Dizziness and weakness    Immunizations Administered    Name Date Dose VIS Date Route   Pfizer COVID-19 Vaccine 11/13/2019  2:14 PM 0.3 mL 10/03/2019 Intramuscular   Manufacturer: [place]   Lot: [id number]   [place]: [id number]

## 2019-12-04 ENCOUNTER — Ambulatory Visit: Payer: Medicare Other | Attending: Internal Medicine

## 2019-12-04 DIAGNOSIS — Z23 Encounter for immunization: Secondary | ICD-10-CM | POA: Insufficient documentation

## 2019-12-04 NOTE — Progress Notes (Signed)
   Covid-19 Vaccination Clinic  Name:  Aaron David    MRN: [id number] DOB: 1951/04/21  12/04/2019  [name] was observed post Covid-19 immunization for 15 minutes without incidence. He was provided with Vaccine Information Sheet and instruction to access the V-Safe system.   [name] was instructed to call 911 with any severe reactions post vaccine: Marland Kitchen Difficulty breathing  . Swelling of your face and throat  . A fast heartbeat  . A bad rash all over your body  . Dizziness and weakness    Immunizations Administered    Name Date Dose VIS Date Route   Pfizer COVID-19 Vaccine 12/04/2019  3:14 PM 0.3 mL 10/03/2019 Intramuscular   Manufacturer: [place]   Lot: [id number]   [place]: [id number]

## 2020-02-08 NOTE — Progress Notes (Signed)
Primary Physician/Referring:  Maurice Small, MD  Patient ID: Aaron David, male    DOB: Feb 17, 1951, 69 y.o.   MRN: [id number]  Chief Complaint  Patient presents with  . Hypertension  . Hyperlipidemia  . Chest Pain  . Follow-up    6 month   HPI:    Aaron David  is a 69 y.o. Asian [place] male with moderate coronary artery disease by angiogram on 01/23/2017, exertionalll chest pain, GERD presents for 6 month f/u. His blood pressure was well controlled on 40 mg, his reduce the dose to 20 mg once a day.  He is now taking statins regularly.  Denies recurrence of angina since being on aggressive medical therapy.  States that he has been doing well, his blood pressure has been very low and hence although asymptomatic has held his Benicar for the past  3 weeks or so.  Past Medical History:  Diagnosis Date  . Acid reflux   . Borderline diabetes   . Headache    Past Surgical History:  Procedure Laterality Date  . LEFT HEART CATH AND CORONARY ANGIOGRAPHY N/A 01/23/2017   Procedure: Left Heart Cath and Coronary Angiography;  Surgeon: Adrian Prows, MD;  Location: [facility];  Service: Cardiovascular;  Laterality: N/A;   Social History   Tobacco Use  . Smoking status: Never Smoker  . Smokeless tobacco: Never Used  Substance Use Topics  . Alcohol use: No    Alcohol/week: 0.0 standard drinks   Marital Status: Married  ROS  Review of Systems  Cardiovascular: Positive for dyspnea on exertion. Negative for chest pain and leg swelling.  Gastrointestinal: Negative for melena.   Objective   Vitals with BMI 02/09/2020 02/09/2020 08/11/2019  Height - _0  _1   Weight - 132 lbs 3 oz 128 lbs 6 oz  BMI - 79.39 03.00  Systolic 923 300 762  Diastolic 87 83 78  Pulse 65 62 75    Physical Exam  Cardiovascular: Normal rate, regular rhythm, normal heart sounds and intact distal pulses. Exam reveals no gallop.  No murmur heard. No leg edema, no JVD.  Pulmonary/Chest:  Effort normal and breath sounds normal.  Abdominal: Soft. Bowel sounds are normal.   Radiology: No results found.  Laboratory examination:    Recent Labs    08/07/19 1351  NA 141  K 4.9  CL 104  CO2 26  GLUCOSE 159*  BUN 13  CREATININE 0.89  CALCIUM 9.2  GFRNONAA 88  GFRAA 102   CMP Latest Ref Rng & Units 08/07/2019 07/04/2019 01/23/2017  Glucose 65 - 99 mg/dL 159(H) - 131(H)  BUN 8 - 27 mg/dL 13 - 7  Creatinine 0.76 - 1.27 mg/dL 0.89 - 0.86  Sodium 134 - 144 mmol/L 141 - 141  Potassium 3.5 - 5.2 mmol/L 4.9 - 4.0  Chloride 96 - 106 mmol/L 104 - 108  CO2 20 - 29 mmol/L 26 - 27  Calcium 8.6 - 10.2 mg/dL 9.2 - 8.5(L)  Total Protein 6.0 - 8.5 g/dL - 6.2 -  Total Bilirubin 0.0 - 1.2 mg/dL - 0.4 -  Alkaline Phos 39 - 117 IU/L - 51 -  AST 0 - 40 IU/L - 22 -  ALT 0 - 44 IU/L - 19 -   CBC Latest Ref Rng & Units 01/23/2017  WBC 4.0 - 10.5 K/uL 7.8  Hemoglobin 13.0 - 17.0 g/dL 14.1  Hematocrit 39.0 - 52.0 % 41.4  Platelets 150 - 400 K/uL 141(L)  Lipid Panel     Component Value Date/Time   CHOL 123 07/04/2019 0904   TRIG 72 07/04/2019 0904   HDL 43 07/04/2019 0904   LDLCALC 65 07/04/2019 0904    External Labs:  Cholesterol, Total 131.000 12/02/2019 Triglycerides 232.000 12/02/2019 HDL 36.000 12/02/2019 LDL 49  A1C 6.500 12/02/2019  Hemoglobin 14.100 01/23/2017   Creatinine, Serum 0.830 12/02/2019 Potassium 4.200 12/02/2019 ALT (SGPT) 22.000 12/02/2019  Labs 05/22/2019: Serum glucose 131 mg, A1c 6.2%.  BUN 13, creatinine 0.90, eGFR greater than 60 mL, potassium 4.4, CMP normal.   Vitamin D normal, total cholesterol 172, triglycerides 83, HDL 40, LDL 115.  Non-HDL cholesterol 131.  PSA normal.  08/26/2018: Hemoglobin A1c 6.5%.  Vitamin D 66.4.  Cholesterol 185, triglycerides 123, HDL 36, LDL 125.  Medications   Current Outpatient Medications  Medication Instructions  . albuterol (PROVENTIL HFA;VENTOLIN HFA) 108 (90 BASE) MCG/ACT inhaler 2 puffs, Inhalation, Every 6 hours PRN   . aspirin 81 mg, Oral, Daily  . atorvastatin (LIPITOR) 10 mg, Oral, Daily-1800  . Azelastine-Fluticasone (DYMISTA) 137-50 MCG/ACT SUSP 1 spray, Nasal, 2 times daily before meals  . carvedilol (COREG) 6.25 mg, Oral, 2 times daily  . Cyanocobalamin (VITAMIN B 12 PO) 1 tablet, Oral, Daily  . EPINEPHrine (EPI-PEN) 0.3 mg, Intramuscular, Once PRN  . metFORMIN (GLUCOPHAGE) 250 mg, Oral, 2 times daily with meals  . montelukast (SINGULAIR) 10 mg, Oral, Daily at bedtime  . Multiple Vitamin (MULTIVITAMIN) tablet 1 tablet, Oral, Daily  . olmesartan (BENICAR) 40 mg, Oral, Every evening  . pantoprazole (PROTONIX) 40 MG tablet Daily  . VITAMIN D PO 1,000 Units, Oral, Daily    Cardiac Studies:   Nuclear stress test [10/21/2014]:  1. The resting electrocardiogram demonstrated normal sinus rhythm, normal resting conduction, no resting arrhythmias and normal rest repolarization. The stress electrocardiogram was at most equivocal for ischemia. There was 1 mm upsloping ST depression with exercise, back to baseline into recovery. Stress test terminated due to fatigue. Normal blood pressure response. The patient completed an estimated workload of 7.4 METS. 2. Myocardial perfusion imaging is normal. Overall left ventricular systolic function was normal without regional wall motion abnormalities. The left ventricular ejection fraction was 52%.  Cardiac cath 01/23/2017: Proximal LAD 30%, ostial ramus 40%, mid RCA mild disease. Normal LVEF.  EKG:  EKG 02/09/2020: Normal sinus rhythm with rate of 61 bpm, normal axis.  Poor R wave progression, probably normal variant.  No evidence of ischemia, normal EKG. No significant change from EKG 06/10/2019   Assessment     ICD-10-CM   1. Angina pectoris ([place])  I20.9 EKG 12-Lead  2. Hypercholesteremia  E78.00   3. Essential hypertension  I10   4. Controlled type 2 diabetes mellitus with complication, without long-term current use of insulin (HCC)  E11.8      Recommendations:   Aaron David  is a 69 y.o. [place] male with moderate coronary artery disease by angiogram on 01/23/2017, exertionalll chest pain, GERD presents for 6 month f/u. His blood pressure was well controlled on 40 mg, his reduce the dose to 20 mg once a day.  He is now taking statins regularly.  Denies recurrence of angina since being on aggressive medical therapy.   His blood pressure was elevated today, advised him to restart Benicar at least at 20 mg, as he had low blood pressure with 40 mg.  He will continue to monitor the blood pressure at home and let us know if blood pressure, diastolic >53  mmHg, goal blood pressure 130/80 mmHg.  He is now been compliant with his statins, LDL has normalized however evaluation of his recent labs revealed elevated triglycerides and also A1c has increased to 6.5.  States that he will continue to make lifestyle changes, will also avoid eating desserts and he will follow up with the PCP for repeat checking in a few months.  From cardiac standpoint without recurrence of angina, I wanted to see him back on a as needed basis but patient wishes to see me back in 6 months.  Probably best option is to start him on Metformin for diabetes.  But patient would like to make lifestyle changes first.   Adrian Prows, MD, [facility] 02/09/2020, 9:23 AM [facility]. [place] Office: [phone]

## 2020-02-09 ENCOUNTER — Ambulatory Visit: Payer: Medicare Other | Admitting: Cardiology

## 2020-02-09 ENCOUNTER — Encounter: Payer: Self-pay | Admitting: Cardiology

## 2020-02-09 ENCOUNTER — Other Ambulatory Visit: Payer: Self-pay

## 2020-02-09 VITALS — BP 148/87 | HR 65 | Temp 98.3°F | Resp 18 | Ht 68.0 in | Wt 132.2 lb

## 2020-02-09 DIAGNOSIS — I10 Essential (primary) hypertension: Secondary | ICD-10-CM

## 2020-02-09 DIAGNOSIS — E7800 Pure hypercholesterolemia, unspecified: Secondary | ICD-10-CM

## 2020-02-09 DIAGNOSIS — E118 Type 2 diabetes mellitus with unspecified complications: Secondary | ICD-10-CM

## 2020-02-09 DIAGNOSIS — I209 Angina pectoris, unspecified: Secondary | ICD-10-CM

## 2020-08-12 ENCOUNTER — Encounter: Payer: Self-pay | Admitting: Cardiology

## 2020-08-12 ENCOUNTER — Ambulatory Visit: Payer: Medicare Other | Admitting: Cardiology

## 2020-08-12 ENCOUNTER — Other Ambulatory Visit: Payer: Self-pay

## 2020-08-12 VITALS — BP 138/83 | HR 75 | Resp 15 | Ht 68.0 in | Wt 131.0 lb

## 2020-08-12 DIAGNOSIS — E7800 Pure hypercholesterolemia, unspecified: Secondary | ICD-10-CM

## 2020-08-12 DIAGNOSIS — I10 Essential (primary) hypertension: Secondary | ICD-10-CM

## 2020-08-12 DIAGNOSIS — E118 Type 2 diabetes mellitus with unspecified complications: Secondary | ICD-10-CM

## 2020-08-12 DIAGNOSIS — I25118 Atherosclerotic heart disease of native coronary artery with other forms of angina pectoris: Secondary | ICD-10-CM

## 2020-08-12 MED ORDER — ATORVASTATIN CALCIUM 10 MG PO TABS: ORAL_TABLET | ORAL | 3 refills | Status: DC

## 2020-08-12 MED ORDER — CARVEDILOL 12.5 MG PO TABS: 12.5000 mg | ORAL_TABLET | Freq: Two times a day (BID) | ORAL | 3 refills | Status: DC

## 2020-08-12 MED ORDER — HYDROCHLOROTHIAZIDE 12.5 MG PO CAPS: 12.5000 mg | ORAL_CAPSULE | Freq: Every morning | ORAL | 3 refills | Status: DC

## 2020-08-12 NOTE — Patient Instructions (Signed)
Get blood work in about 2 weeks at [facility].

## 2020-08-12 NOTE — Progress Notes (Signed)
Primary Physician/Referring:  Maurice Small, MD  Patient ID: Aaron David, male    DOB: [birth year], 69 y.o.   MRN: [id number]  Chief Complaint  Patient presents with  . Follow-up    6 month  . Chest Pain  . Hypertension   HPI:    Aaron David  is a 69 y.o. [place] male with moderate coronary artery disease by angiogram on 01/23/2017, history of exertional chest pain, hypertension, hyperlipidemia, and GERD.  He has been without recurrence of angina since initiation of medical therapy.  Patient presents today for 43-monthfollow-up of coronary artery disease and hypertension.  He has had no recurrence of angina since last visit.  He continues to monitor his blood pressure readings at home and has noticed consistent systolic readings greater than 130.  Therefore patient increase telmisartan from 20 mg daily to 40 mg daily about 1 week ago.  Patient reports he is slowly been increasing physical activity and is tolerating this well.  Home BP >130. Some acid reflux, but last seconds. Slowly increasing activity. No CP.   Past Medical History:  Diagnosis Date  . Acid reflux   . Borderline diabetes   . Headache    Past Surgical History:  Procedure Laterality Date  . LEFT HEART CATH AND CORONARY ANGIOGRAPHY N/A 01/23/2017   Procedure: Left Heart Cath and Coronary Angiography;  Surgeon: JAdrian Prows MD;  [facility];  Service: Cardiovascular;  Laterality: N/A;   Social History   Tobacco Use  . Smoking status: Never Smoker  . Smokeless tobacco: Never Used  Substance Use Topics  . Alcohol use: No    Alcohol/week: 0.0 standard drinks   Marital Status: Married  ROS  Review of Systems  Cardiovascular: Negative for chest pain, dyspnea on exertion, leg swelling, orthopnea, palpitations and paroxysmal nocturnal dyspnea.  Gastrointestinal: Negative for melena.  Neurological: Negative for dizziness.   Objective   Vitals with BMI 08/12/2020 02/09/2020 02/09/2020   Height _0  - _1   Weight 131 lbs - 132 lbs 3 oz  BMI 135.00- 293.81 Systolic [id number] Diastolic 83 87 83  Pulse 75 65 62    Physical Exam Vitals reviewed.  HENT:     Head: Normocephalic and atraumatic.  Cardiovascular:     Rate and Rhythm: Normal rate and regular rhythm.     Pulses: Intact distal pulses.     Heart sounds: Normal heart sounds, S1 normal and S2 normal. No murmur heard.  No gallop.      Comments: No leg edema, no JVD. Pulmonary:     Effort: Pulmonary effort is normal. No respiratory distress.     Breath sounds: Normal breath sounds. No wheezing, rhonchi or rales.  Abdominal:     General: Bowel sounds are normal.     Palpations: Abdomen is soft.  Musculoskeletal:     Right lower leg: No edema.     Left lower leg: No edema.  Neurological:     Mental Status: He is alert.    Laboratory examination:    No results for input(s): NA, K, CL, CO2, GLUCOSE, BUN, CREATININE, CALCIUM, GFRNONAA, GFRAA in the last 8760 hours. CMP Latest Ref Rng & Units 08/07/2019 07/04/2019 01/23/2017  Glucose 65 - 99 mg/dL 159(H) - 131(H)  BUN 8 - 27 mg/dL 13 - 7  Creatinine 0.76 - 1.27 mg/dL 0.89 - 0.86  Sodium 134 - 144 mmol/L 141 - 141  Potassium 3.5 - 5.2 mmol/L  4.9 - 4.0  Chloride 96 - 106 mmol/L 104 - 108  CO2 20 - 29 mmol/L 26 - 27  Calcium 8.6 - 10.2 mg/dL 9.2 - 8.5(L)  Total Protein 6.0 - 8.5 g/dL - 6.2 -  Total Bilirubin 0.0 - 1.2 mg/dL - 0.4 -  Alkaline Phos 39 - 117 IU/L - 51 -  AST 0 - 40 IU/L - 22 -  ALT 0 - 44 IU/L - 19 -   CBC Latest Ref Rng & Units 01/23/2017  WBC 4.0 - 10.5 K/uL 7.8  Hemoglobin 13.0 - 17.0 g/dL 14.1  Hematocrit 39 - 52 % 41.4  Platelets 150 - 400 K/uL 141(L)   Lipid Panel     Component Value Date/Time   CHOL 123 07/04/2019 0904   TRIG 72 07/04/2019 0904   HDL 43 07/04/2019 0904   LDLCALC 65 07/04/2019 0904    External Labs: 07/08/2020: HDL 42, LDL 72, total 130, triglycerides 82 A1c 6.9%, glucose 151 BUN 14, creatinine 0.87,  EGFR 88, CrCl 73.33 TSH 2.99   Cholesterol, Total 131.000 12/02/2019 Triglycerides 232.000 12/02/2019 HDL 36.000 12/02/2019 LDL 49  A1C 6.500 12/02/2019  Hemoglobin 14.100 01/23/2017   Creatinine, Serum 0.830 12/02/2019 Potassium 4.200 12/02/2019 ALT (SGPT) 22.000 12/02/2019  Labs 05/22/2019: Serum glucose 131 mg, A1c 6.2%.  BUN 13, creatinine 0.90, eGFR greater than 60 mL, potassium 4.4, CMP normal.   Vitamin D normal, total cholesterol 172, triglycerides 83, HDL 40, LDL 115.  Non-HDL cholesterol 131.  PSA normal.  08/26/2018: Hemoglobin A1c 6.5%.  Vitamin D 66.4.  Cholesterol 185, triglycerides 123, HDL 36, LDL 125.  Medications   Current Outpatient Medications  Medication Instructions  . albuterol (PROVENTIL HFA;VENTOLIN HFA) 108 (90 BASE) MCG/ACT inhaler 2 puffs, Inhalation, Every 6 hours PRN  . aspirin 81 mg, Oral, Daily  . atorvastatin (LIPITOR) 10 MG tablet Take 86m Tuesday and Thursday. All other days of the week take 142m  . Azelastine-Fluticasone (DYMISTA) 137-50 MCG/ACT SUSP 1 spray, Nasal, 2 times daily before meals  . carvedilol (COREG) 12.5 mg, Oral, 2 times daily  . Cyanocobalamin (VITAMIN B 12 PO) 1 tablet, Oral, Daily  . EPINEPHrine (EPI-PEN) 0.3 mg, Intramuscular, Once PRN  . fluticasone (FLONASE) 50 MCG/ACT nasal spray 1 spray by Each Nare route.  . Marland Kitchenlucose blood (ONETOUCH VERIO) test strip 1 strip.  . hydrochlorothiazide (MICROZIDE) 12.5 mg, Oral, Every morning  . metFORMIN (GLUCOPHAGE) 250 mg, Oral, 2 times daily with meals  . montelukast (SINGULAIR) 10 mg, Oral, Daily at bedtime  . Multiple Vitamin (MULTIVITAMIN) tablet 1 tablet, Oral, Daily  . olmesartan (BENICAR) 40 mg, Oral, Every evening  . ONETOUCH VERIO test strip 1 each, Daily  . pantoprazole (PROTONIX) 40 MG tablet Daily  . VITAMIN D PO 1,000 Units, Oral, Daily    Radiology:   No results found.  Cardiac Studies:   Nuclear stress test [10/21/2014]:  1. The resting electrocardiogram demonstrated normal  sinus rhythm, normal resting conduction, no resting arrhythmias and normal rest repolarization. The stress electrocardiogram was at most equivocal for ischemia. There was 1 mm upsloping ST depression with exercise, back to baseline into recovery. Stress test terminated due to fatigue. Normal blood pressure response. The patient completed an estimated workload of 7.4 METS. 2. Myocardial perfusion imaging is normal. Overall left ventricular systolic function was normal without regional wall motion abnormalities. The left ventricular ejection fraction was 52%.  Cardiac cath 01/23/2017: Proximal LAD 30%, ostial ramus 40%, mid RCA mild disease. Normal LVEF.   EKG:  EKG 08/12/2020: Sinus rhythm at a rate of 72 bpm, left atrial abnormality.  Normal axis.  Poor R wave progression, cannot exclude anterior infarct old.  Nonspecific T wave abnormality.  Compared to EKG 02/09/2020, left atrial abnormality new.  Assessment     ICD-10-CM   1. Coronary artery disease of native artery of native heart with stable angina pectoris (HCC)  I25.118 EKG 12-Lead    carvedilol (COREG) 12.5 MG tablet  2. Hypercholesteremia  E78.00 atorvastatin (LIPITOR) 10 MG tablet  3. Essential hypertension  I10 carvedilol (COREG) 12.5 MG tablet    hydrochlorothiazide (MICROZIDE) 12.5 MG capsule    Basic metabolic panel  4. Controlled type 2 diabetes mellitus with complication, without long-term current use of insulin (HCC)  E11.8     Recommendations:   Aaron David  is a 69 y.o. [place] male with moderate coronary artery disease by angiogram on 01/23/2017, history of exertional chest pain, hypertension, hyperlipidemia, and GERD.  He has been without recurrence of angina since initiation of medical therapy.  Patient presents for 85-monthfollow-up of coronary artery disease, hypertension, hyperlipidemia.  He is stable without recurrence of angina and no clinical signs of heart failure.  He is currently tolerating  guideline directed medical therapy for CAD well.  We will continue aspirin 81 mg.  Blood pressure remains elevated above goal, despite patient having increased olmesartan from 20 mg to 40 mg daily for the last 1 week.  Will increase carvedilol from 6.275mto 12.5 mg twice daily and will add 12.5 mg HCTZ daily.  Will obtain basic metabolic panel in approximately 1 to 2 weeks.  I reviewed external labs, diabetes is well controlled.  However LDL remains above goal at 72.  In view of patient's elevated cardiovascular risk factors LDL goal is less than 70.  Patient is currently taking 10 mg atorvastatin daily.  Of note patient has experienced myalgias with higher doses of statin therapy previously.  Will change this to 20 mg atorvastatin on Tuesdays and Thursdays, and 10 mg atorvastatin all other days of the week.  Discussed this with patient, he verbalized understanding and agreement.  We will recheck lipid panel in 6 months.  Encourage patient to continue healthy lifestyle modifications.  Discussed at length exercises to improve heart health as well as muscle strength.  Follow-up in 6 weeks for hypertension.   Patient was seen in collaboration with Dr. GaEinar GipHe also reviewed patient's chart and examined the patient. Dr. GaEinar Gips in agreement of the plan.    This was a 30-minute encounter with face-to-face counseling regarding diet/exercise/muscle strengthening, medical records review, coordination of care, explanation of complex medical issues, complex medical decision making.     CeAlethia BertholdPA-C 08/12/2020, 3:01 PM Office: [phone]

## 2020-08-16 ENCOUNTER — Other Ambulatory Visit: Payer: Self-pay

## 2020-08-16 DIAGNOSIS — I10 Essential (primary) hypertension: Secondary | ICD-10-CM

## 2020-08-16 DIAGNOSIS — I25118 Atherosclerotic heart disease of native coronary artery with other forms of angina pectoris: Secondary | ICD-10-CM

## 2020-08-16 MED ORDER — CARVEDILOL 12.5 MG PO TABS: 12.5000 mg | ORAL_TABLET | Freq: Two times a day (BID) | ORAL | 3 refills | Status: DC

## 2020-08-16 MED ORDER — HYDROCHLOROTHIAZIDE 12.5 MG PO CAPS: 12.5000 mg | ORAL_CAPSULE | Freq: Every morning | ORAL | 3 refills | Status: DC

## 2020-09-03 ENCOUNTER — Other Ambulatory Visit: Payer: Self-pay

## 2020-09-03 DIAGNOSIS — I10 Essential (primary) hypertension: Secondary | ICD-10-CM

## 2020-09-04 LAB — BASIC METABOLIC PANEL
BUN/Creatinine Ratio: 16 (ref 10–24)
BUN: 15 mg/dL (ref 8–27)
CO2: 23 mmol/L (ref 20–29)
Calcium: 8.8 mg/dL (ref 8.6–10.2)
Chloride: 103 mmol/L (ref 96–106)
Creatinine, Ser: 0.92 mg/dL (ref 0.76–1.27)
GFR calc Af Amer: 98 mL/min/{1.73_m2} (ref >59)
GFR calc non Af Amer: 85 mL/min/{1.73_m2} (ref >59)
Glucose: 87 mg/dL (ref 65–99)
Potassium: 4.3 mmol/L (ref 3.5–5.2)
Sodium: 141 mmol/L (ref 134–144)

## 2020-09-06 NOTE — Progress Notes (Signed)
Please inform patient:  Renal function and electrolytes are normal. Safe to continue taking HCT.

## 2020-09-24 ENCOUNTER — Other Ambulatory Visit: Payer: Self-pay

## 2020-09-24 ENCOUNTER — Ambulatory Visit: Payer: Medicare Other | Admitting: Cardiology

## 2020-09-24 ENCOUNTER — Encounter: Payer: Self-pay | Admitting: Cardiology

## 2020-09-24 VITALS — BP 152/87 | HR 76 | Resp 15 | Ht 68.0 in | Wt 134.0 lb

## 2020-09-24 DIAGNOSIS — E118 Type 2 diabetes mellitus with unspecified complications: Secondary | ICD-10-CM

## 2020-09-24 DIAGNOSIS — I2510 Atherosclerotic heart disease of native coronary artery without angina pectoris: Secondary | ICD-10-CM

## 2020-09-24 DIAGNOSIS — I10 Essential (primary) hypertension: Secondary | ICD-10-CM

## 2020-09-24 DIAGNOSIS — E7800 Pure hypercholesterolemia, unspecified: Secondary | ICD-10-CM

## 2020-09-24 NOTE — Progress Notes (Signed)
Primary Physician/Referring:  Maurice Small, MD  Patient ID: Aaron David, male    DOB: 25-Feb-1951, 69 y.o.   MRN: [id number]  Chief Complaint  Patient presents with  . Follow-up    6 week  . Hypertension   HPI:    Aaron David  is a 69 y.o. Asian [place] male with moderate coronary artery disease by angiogram on 01/23/2017, exertionalll chest pain, GERD presents for 6 month f/u.  He is now taking statins regularly.  Denies recurrence of angina since being on aggressive medical therapy.  States that he has been doing well, his blood pressure has been very well controlled and brings home blood pressure recordings.  He essentially remains asymptomatic.  Past Medical History:  Diagnosis Date  . Acid reflux   . Borderline diabetes   . Headache    Past Surgical History:  Procedure Laterality Date  . LEFT HEART CATH AND CORONARY ANGIOGRAPHY N/A 01/23/2017   Procedure: Left Heart Cath and Coronary Angiography;  Surgeon: Adrian Prows, MD;  Location: [facility];  Service: Cardiovascular;  Laterality: N/A;   Social History   Tobacco Use  . Smoking status: Never Smoker  . Smokeless tobacco: Never Used  Substance Use Topics  . Alcohol use: No    Alcohol/week: 0.0 standard drinks   Marital Status: Married  ROS  Review of Systems  Cardiovascular: Negative for chest pain, dyspnea on exertion and leg swelling.  Gastrointestinal: Negative for melena.  All other systems reviewed and are negative.  Objective   Vitals with BMI 09/24/2020 09/24/2020 08/12/2020  Height - 5' 8" 5' 8"  Weight - 134 lbs 131 lbs  BMI - 13.24 40.10  Systolic 272 536 644  Diastolic 87 78 83  Pulse - 76 75    Physical Exam Cardiovascular:     Rate and Rhythm: Normal rate and regular rhythm.     Pulses: Intact distal pulses.     Heart sounds: Normal heart sounds. No murmur heard.  No gallop.      Comments: No leg edema, no JVD. Pulmonary:     Effort: Pulmonary effort is normal.      Breath sounds: Normal breath sounds.  Abdominal:     General: Bowel sounds are normal.     Palpations: Abdomen is soft.    Radiology: No results found.  Laboratory examination:    Recent Labs    09/03/20 1446  NA 141  K 4.3  CL 103  CO2 23  GLUCOSE 87  BUN 15  CREATININE 0.92  CALCIUM 8.8  GFRNONAA 85  GFRAA 98   CMP Latest Ref Rng & Units 09/03/2020 08/07/2019 07/04/2019  Glucose 65 - 99 mg/dL 87 159(H) -  BUN 8 - 27 mg/dL 15 13 -  Creatinine 0.76 - 1.27 mg/dL 0.92 0.89 -  Sodium 134 - 144 mmol/L 141 141 -  Potassium 3.5 - 5.2 mmol/L 4.3 4.9 -  Chloride 96 - 106 mmol/L 103 104 -  CO2 20 - 29 mmol/L 23 26 -  Calcium 8.6 - 10.2 mg/dL 8.8 9.2 -  Total Protein 6.0 - 8.5 g/dL - - 6.2  Total Bilirubin 0.0 - 1.2 mg/dL - - 0.4  Alkaline Phos 39 - 117 IU/L - - 51  AST 0 - 40 IU/L - - 22  ALT 0 - 44 IU/L - - 19   CBC Latest Ref Rng & Units 01/23/2017  WBC 4.0 - 10.5 K/uL 7.8  Hemoglobin 13.0 - 17.0 g/dL 14.1  Hematocrit 39 - 52 % 41.4  Platelets 150 - 400 K/uL 141(L)   Lipid Panel     Component Value Date/Time   CHOL 123 07/04/2019 0904   TRIG 72 07/04/2019 0904   HDL 43 07/04/2019 0904   LDLCALC 65 07/04/2019 0904    External Labs:  Cholesterol, Total 131.000 12/02/2019 Triglycerides 232.000 12/02/2019 HDL 36.000 12/02/2019 LDL 49  A1C 6.500 12/02/2019  Hemoglobin 14.100 01/23/2017   Creatinine, Serum 0.830 12/02/2019 Potassium 4.200 12/02/2019 ALT (SGPT) 22.000 12/02/2019  Labs 05/22/2019: Serum glucose 131 mg, A1c 6.2%.  BUN 13, creatinine 0.90, eGFR greater than 60 mL, potassium 4.4, CMP normal.   Vitamin D normal, total cholesterol 172, triglycerides 83, HDL 40, LDL 115.  Non-HDL cholesterol 131.  PSA normal.  08/26/2018: Hemoglobin A1c 6.5%.  Vitamin D 66.4.  Cholesterol 185, triglycerides 123, HDL 36, LDL 125.  Medications   Current Outpatient Medications  Medication Instructions  . albuterol (PROVENTIL HFA;VENTOLIN HFA) 108 (90 BASE) MCG/ACT inhaler 2 puffs,  Inhalation, Every 6 hours PRN  . aspirin 81 mg, Oral, Daily  . atorvastatin (LIPITOR) 10 MG tablet Take 68m Tuesday and Thursday. All other days of the week take 119m  . Azelastine-Fluticasone (DYMISTA) 137-50 MCG/ACT SUSP 1 spray, Nasal, 2 times daily before meals  . carvedilol (COREG) 12.5 mg, Oral, 2 times daily  . Cyanocobalamin (VITAMIN B 12 PO) 1 tablet, Oral, Daily  . EPINEPHrine (EPI-PEN) 0.3 mg, Intramuscular, Once PRN  . fluticasone (FLONASE) 50 MCG/ACT nasal spray 1 spray by Each Nare route.  . Marland Kitchenlucose blood (ONETOUCH VERIO) test strip 1 strip.  . hydrochlorothiazide (MICROZIDE) 12.5 mg, Oral, Every morning  . metFORMIN (GLUCOPHAGE) 250 mg, Oral, 2 times daily with meals  . montelukast (SINGULAIR) 10 mg, Oral, Daily at bedtime  . Multiple Vitamin (MULTIVITAMIN) tablet 1 tablet, Oral, Daily  . olmesartan (BENICAR) 40 mg, Oral, Every evening  . ONETOUCH VERIO test strip 1 each, Daily  . pantoprazole (PROTONIX) 40 MG tablet Daily  . VITAMIN D PO 1,000 Units, Oral, Daily    Cardiac Studies:   Nuclear stress test [10/21/2014]:  1. The resting electrocardiogram demonstrated normal sinus rhythm, normal resting conduction, no resting arrhythmias and normal rest repolarization. The stress electrocardiogram was at most equivocal for ischemia. There was 1 mm upsloping ST depression with exercise, back to baseline into recovery. Stress test terminated due to fatigue. Normal blood pressure response. The patient completed an estimated workload of 7.4 METS. 2. Myocardial perfusion imaging is normal. Overall left ventricular systolic function was normal without regional wall motion abnormalities. The left ventricular ejection fraction was 52%.  Cardiac cath 01/23/2017: Proximal LAD 30%, ostial ramus 40%, mid RCA mild disease. Normal LVEF.  EKG:  EKG 02/09/2020: Normal sinus rhythm with rate of 61 bpm, normal axis.  Poor R wave progression, probably normal variant.  No evidence of ischemia,  normal EKG. No significant change from EKG 06/10/2019   Assessment     ICD-10-CM   1. Essential hypertension  I10   2. Coronary artery disease involving native coronary artery of native heart without angina pectoris  I25.10   3. Hypercholesteremia  E78.00   4. Controlled type 2 diabetes mellitus with complication, without long-term current use of insulin (HCC)  E11.8     Recommendations:   Aaron David a [age].o. [place] with moderate coronary artery disease by angiogram on 01/23/2017, exertional chest pain, GERD presents for 6 month f/u.  Denies recurrence of angina since  being on aggressive medical therapy.   His blood pressure was elevated today, he brings home blood pressure recordings and also his home blood pressure apparatus which coincides with ours and he has had excellent blood pressure control.  Hence I did not make any changes to his medications and advised him to continue to monitor the blood pressure closely at home.  I reviewed his lipids, he has been compliant with his statins, he has normal LDL now.  Diabetes is also well controlled and he continues to be physically active without any limitations.  Continue present medications, I will see him back in 6 months for follow-up.  He likes to see me on a 76-monthbasis.     JAdrian Prows MD, [facility]12/12/2019, 10:38 AM Office: [phone]: [phone]

## 2020-11-23 ENCOUNTER — Other Ambulatory Visit: Payer: Self-pay

## 2020-11-23 DIAGNOSIS — I25118 Atherosclerotic heart disease of native coronary artery with other forms of angina pectoris: Secondary | ICD-10-CM

## 2020-11-23 DIAGNOSIS — I10 Essential (primary) hypertension: Secondary | ICD-10-CM

## 2020-11-23 MED ORDER — CARVEDILOL 12.5 MG PO TABS: 12.5000 mg | ORAL_TABLET | Freq: Two times a day (BID) | ORAL | 1 refills | Status: DC

## 2020-11-26 DIAGNOSIS — E611 Iron deficiency: Secondary | ICD-10-CM | POA: Diagnosis not present

## 2020-11-26 DIAGNOSIS — R1013 Epigastric pain: Secondary | ICD-10-CM | POA: Diagnosis not present

## 2020-11-26 DIAGNOSIS — I10 Essential (primary) hypertension: Secondary | ICD-10-CM | POA: Diagnosis not present

## 2020-11-26 DIAGNOSIS — E538 Deficiency of other specified B group vitamins: Secondary | ICD-10-CM | POA: Diagnosis not present

## 2020-11-26 DIAGNOSIS — Z7984 Long term (current) use of oral hypoglycemic drugs: Secondary | ICD-10-CM | POA: Diagnosis not present

## 2020-11-26 DIAGNOSIS — E119 Type 2 diabetes mellitus without complications: Secondary | ICD-10-CM | POA: Diagnosis not present

## 2020-11-26 DIAGNOSIS — E785 Hyperlipidemia, unspecified: Secondary | ICD-10-CM | POA: Diagnosis not present

## 2020-12-03 DIAGNOSIS — Z20822 Contact with and (suspected) exposure to covid-19: Secondary | ICD-10-CM | POA: Diagnosis not present

## 2021-01-18 DIAGNOSIS — M1990 Unspecified osteoarthritis, unspecified site: Secondary | ICD-10-CM | POA: Diagnosis not present

## 2021-01-18 DIAGNOSIS — E1165 Type 2 diabetes mellitus with hyperglycemia: Secondary | ICD-10-CM | POA: Diagnosis not present

## 2021-01-18 DIAGNOSIS — E559 Vitamin D deficiency, unspecified: Secondary | ICD-10-CM | POA: Diagnosis not present

## 2021-01-18 DIAGNOSIS — E785 Hyperlipidemia, unspecified: Secondary | ICD-10-CM | POA: Diagnosis not present

## 2021-01-18 DIAGNOSIS — I10 Essential (primary) hypertension: Secondary | ICD-10-CM | POA: Diagnosis not present

## 2021-03-14 ENCOUNTER — Other Ambulatory Visit: Payer: Self-pay | Admitting: Physician Assistant

## 2021-03-14 ENCOUNTER — Other Ambulatory Visit (HOSPITAL_COMMUNITY): Payer: Self-pay

## 2021-03-14 DIAGNOSIS — Z20822 Contact with and (suspected) exposure to covid-19: Secondary | ICD-10-CM | POA: Diagnosis not present

## 2021-03-14 MED ORDER — NIRMATRELVIR/RITONAVIR (PAXLOVID)TABLET
3.0000 | ORAL_TABLET | Freq: Two times a day (BID) | ORAL | 0 refills | Status: DC
  Filled 2021-03-14: qty 30, 5d supply, fill #0

## 2021-03-14 NOTE — Progress Notes (Signed)
Outpatient Oral COVID Treatment Note  I connected with Aaron David on 03/14/2021/1:19 PM by telephone and verified that I am speaking with the correct person using two identifiers.  I discussed the limitations, risks, security, and privacy concerns of performing an evaluation and management service by telephone and the availability of in person appointments. I also discussed with the patient that there may be a patient responsible charge related to this service. The patient expressed understanding and agreed to proceed.  Patient location: home Provider location: office   Diagnosis: COVID-19 infection  Purpose of visit: Discussion of potential use of Molnupiravir or Paxlovid, a new treatment for mild to moderate COVID-19 viral infection in non-hospitalized patients.   Subjective: Patient is a 70 y.o. male who has been diagnosed with COVID 19 viral infection.  Their symptoms began on 5/22 with cough and cold.    Past Medical History:  Diagnosis Date  . Acid reflux   . Borderline diabetes   . Headache     Allergies  Allergen Reactions  . Penicillins Rash    itching     Current Outpatient Medications:  .  albuterol (PROVENTIL HFA;VENTOLIN HFA) 108 (90 BASE) MCG/ACT inhaler, Inhale 2 puffs into the lungs every 6 (six) hours as needed for wheezing or shortness of breath., Disp: , Rfl:  .  aspirin 81 MG tablet, Take 81 mg by mouth daily., Disp: , Rfl:  .  atorvastatin (LIPITOR) 10 MG tablet, Take 20mg  Tuesday and Thursday. All other days of the week take 10mg ., Disp: 30 tablet, Rfl: 3 .  Azelastine-Fluticasone (DYMISTA) 137-50 MCG/ACT SUSP, Place 1 spray into the nose 2 (two) times daily before a meal. , Disp: , Rfl:  .  carvedilol (COREG) 12.5 MG tablet, Take 1 tablet (12.5 mg total) by mouth 2 (two) times daily., Disp: 180 tablet, Rfl: 1 .  Cyanocobalamin (VITAMIN B 12 PO), Take 1 tablet by mouth daily., Disp: , Rfl:  .  EPINEPHrine 0.3 mg/0.3 mL IJ SOAJ injection, Inject 0.3 mg  into the muscle once as needed., Disp: , Rfl: 1 .  fluticasone (FLONASE) 50 MCG/ACT nasal spray, 1 spray by Each Nare route., Disp: , Rfl:  .  glucose blood (ONETOUCH VERIO) test strip, 1 strip., Disp: , Rfl:  .  hydrochlorothiazide (MICROZIDE) 12.5 MG capsule, Take 1 capsule (12.5 mg total) by mouth in the morning., Disp: 30 capsule, Rfl: 3 .  metFORMIN (GLUCOPHAGE) 500 MG tablet, Take 250 mg by mouth 2 (two) times daily with a meal., Disp: , Rfl:  .  montelukast (SINGULAIR) 10 MG tablet, Take 10 mg by mouth at bedtime. , Disp: , Rfl:  .  Multiple Vitamin (MULTIVITAMIN) tablet, Take 1 tablet by mouth daily., Disp: , Rfl:  .  olmesartan (BENICAR) 40 MG tablet, Take 40 mg by mouth every evening., Disp: 30 tablet, Rfl: 2 .  ONETOUCH VERIO test strip, 1 each daily., Disp: , Rfl:  .  pantoprazole (PROTONIX) 40 MG tablet, daily., Disp: , Rfl:  .  VITAMIN D PO, Take 1,000 Units by mouth daily., Disp: , Rfl:   Objective: Patient sound stable.  They are in no apparent distress.  Breathing is non labored.  Mood and behavior are normal.  Laboratory Data:  No results found for this or any previous visit (from the past 2160 hour(s)).   Assessment: 70 y.o. male with mild/moderate COVID 19 viral infection diagnosed on 5/22 at high risk for progression to severe COVID 19.  Plan:  This patient is a  70 y.o. male that meets the following criteria for Emergency Use Authorization of: Paxlovid 1. Age >12 yr AND > 40 kg 2. SARS-COV-2 positive test 3. Symptom onset < 5 days 4. Mild-to-moderate COVID disease with high risk for severe progression to hospitalization or death  I have spoken and communicated the following to the patient or parent/caregiver regarding: 1. Paxlovid is an unapproved drug that is authorized for use under an Emergency Use Authorization.  2. There are no adequate, approved, available products for the treatment of COVID-19 in adults who have mild-to-moderate COVID-19 and are at high risk  for progressing to severe COVID-19, including hospitalization or death. 3. Other therapeutics are currently authorized. For additional information on all products authorized for treatment or prevention of COVID-19, please see [employer].  4. There are benefits and risks of taking this treatment as outlined in the "Fact Sheet for Patients and Caregivers."  5. "Fact Sheet for Patients and Caregivers" was reviewed with patient. A hard copy will be provided to patient from pharmacy prior to the patient receiving treatment. 6. Patients should continue to self-isolate and use infection control measures (e.g., wear mask, isolate, social distance, avoid sharing personal items, clean and disinfect "high touch" surfaces, and frequent handwashing) according to CDC guidelines.  7. The patient or parent/caregiver has the option to accept or refuse treatment. 8. Patient medication history was reviewed for potential drug interactions:Interaction with home meds: Lipitor, flonase and dymista 9. Patient's GFR was calculated to be 81, and they were therefore prescribed Normal dose (GFR>60) - nirmatrelvir 150mg  tab (2 tablet) by mouth twice daily AND ritonavir 100mg  tab (1 tablet) by mouth twice daily   After reviewing above information with the patient, the patient agrees to receive Paxlovid.  Follow up instructions:    . Take prescription BID x 5 days as directed . Reach out to pharmacist for counseling on medication if desired . For concerns regarding further COVID symptoms please follow up with your PCP or urgent care . For urgent or life-threatening issues, seek care at your local emergency department  The patient was provided an opportunity to ask questions, and all were answered. The patient agreed with the plan and demonstrated an understanding of the instructions.   Script sent to [facility] and opted to pick up RX.  The patient was advised to call their PCP or seek an in-person evaluation if the symptoms worsen or if the condition fails to improve as anticipated.   I provided 10 minutes of non face-to-face telephone visit time during this encounter, and > 50% was spent counseling as documented under my assessment & plan.  Angelena Form, PA-C 03/14/2021 /1:19 PM

## 2021-03-25 ENCOUNTER — Ambulatory Visit: Payer: Medicare Other | Admitting: Cardiology

## 2021-04-07 ENCOUNTER — Other Ambulatory Visit: Payer: Self-pay

## 2021-04-07 DIAGNOSIS — I10 Essential (primary) hypertension: Secondary | ICD-10-CM

## 2021-04-11 DIAGNOSIS — Z7984 Long term (current) use of oral hypoglycemic drugs: Secondary | ICD-10-CM | POA: Diagnosis not present

## 2021-04-11 DIAGNOSIS — E538 Deficiency of other specified B group vitamins: Secondary | ICD-10-CM | POA: Diagnosis not present

## 2021-04-11 DIAGNOSIS — E559 Vitamin D deficiency, unspecified: Secondary | ICD-10-CM | POA: Diagnosis not present

## 2021-04-11 DIAGNOSIS — R5382 Chronic fatigue, unspecified: Secondary | ICD-10-CM | POA: Diagnosis not present

## 2021-04-11 DIAGNOSIS — R059 Cough, unspecified: Secondary | ICD-10-CM | POA: Diagnosis not present

## 2021-04-11 DIAGNOSIS — E611 Iron deficiency: Secondary | ICD-10-CM | POA: Diagnosis not present

## 2021-04-11 DIAGNOSIS — I10 Essential (primary) hypertension: Secondary | ICD-10-CM | POA: Diagnosis not present

## 2021-04-11 DIAGNOSIS — E782 Mixed hyperlipidemia: Secondary | ICD-10-CM | POA: Diagnosis not present

## 2021-04-11 DIAGNOSIS — E119 Type 2 diabetes mellitus without complications: Secondary | ICD-10-CM | POA: Diagnosis not present

## 2021-04-12 ENCOUNTER — Ambulatory Visit
Admission: RE | Admit: 2021-04-12 | Discharge: 2021-04-12 | Disposition: A | Discharge status: Home / Self Care | Payer: Medicare Other | Source: Ambulatory Visit | Attending: Family Medicine | Admitting: Family Medicine

## 2021-04-12 ENCOUNTER — Other Ambulatory Visit: Payer: Self-pay | Admitting: Family Medicine

## 2021-04-12 DIAGNOSIS — R059 Cough, unspecified: Secondary | ICD-10-CM

## 2021-05-09 ENCOUNTER — Other Ambulatory Visit: Payer: Self-pay

## 2021-05-09 ENCOUNTER — Encounter: Payer: Self-pay | Admitting: Cardiology

## 2021-05-09 ENCOUNTER — Ambulatory Visit: Payer: Medicare Other | Admitting: Cardiology

## 2021-05-09 VITALS — BP 137/86 | HR 82 | Temp 98.2°F | Resp 17 | Ht 68.0 in | Wt 130.0 lb

## 2021-05-09 DIAGNOSIS — L609 Nail disorder, unspecified: Secondary | ICD-10-CM | POA: Diagnosis not present

## 2021-05-09 DIAGNOSIS — I2510 Atherosclerotic heart disease of native coronary artery without angina pectoris: Secondary | ICD-10-CM | POA: Diagnosis not present

## 2021-05-09 DIAGNOSIS — E7800 Pure hypercholesterolemia, unspecified: Secondary | ICD-10-CM | POA: Diagnosis not present

## 2021-05-09 DIAGNOSIS — I10 Essential (primary) hypertension: Secondary | ICD-10-CM | POA: Diagnosis not present

## 2021-05-09 MED ORDER — AMLODIPINE BESYLATE 5 MG PO TABS: 5.0000 mg | ORAL_TABLET | Freq: Every day | ORAL | 2 refills | Status: DC

## 2021-05-09 NOTE — Progress Notes (Deleted)
Primary Physician/Referring:  Maurice Small, MD  Patient ID: Aaron David, male    DOB: 09-07-1951, 70 y.o.   MRN: [id number]  No chief complaint on file.  HPI:    Aaron David  is a 70 y.o. [place] male with moderate coronary artery disease by angiogram on 01/23/2017, exertional chest pain, GERD presents for 6 month f/u.  He is now taking statins regularly.  Denies recurrence of angina since being on aggressive medical therapy.  Patient states that he has been doing well. He does endorse persistent fatigue since he had COVID 2 months ago. He states that he had a follow up CXR two weeks ago which looked normal.   The patient has continued to monitor his BP at home. He states that his average readings are around 130/85-90. He is taking Olmesartan and HCTZ. He states that the HCTZ causes him to urinate frequently. Elevated BP in office of 137/86. BP was rechecked once the patient was settled in the room and was found to be 142/84.   The patient was recently placed on an iron supplement by his PCP.   Patient is concerned about a dark spot that appeared on his left great toe.  Past Medical History:  Diagnosis Date   Acid reflux    Borderline diabetes    Headache    Past Surgical History:  Procedure Laterality Date   LEFT HEART CATH AND CORONARY ANGIOGRAPHY N/A 01/23/2017   Procedure: Left Heart Cath and Coronary Angiography;  Surgeon: Adrian Prows, MD;  Location: [facility];  Service: Cardiovascular;  Laterality: N/A;   Social History   Tobacco Use   Smoking status: Never   Smokeless tobacco: Never  Substance Use Topics   Alcohol use: No    Alcohol/week: 0.0 standard drinks   Marital Status: Married  ROS  Review of Systems  Cardiovascular:  Negative for chest pain, dyspnea on exertion and leg swelling.  Skin:  Positive for nail changes (on right great toe).  Gastrointestinal:  Negative for melena.  Genitourinary:  Positive for frequency (when on HCTZ).   All other systems reviewed and are negative. Objective   Vitals with BMI 09/24/2020 09/24/2020 08/12/2020  Height - _0  _1   Weight - 134 lbs 131 lbs  BMI - 29.47 65.46  Systolic 503 546 568  Diastolic 87 78 83  Pulse - 76 75    Physical Exam Cardiovascular:     Rate and Rhythm: Normal rate and regular rhythm.     Pulses: Intact distal pulses.     Heart sounds: Normal heart sounds. No murmur heard.   No gallop.     Comments: No leg edema, no JVD. Pulmonary:     Effort: Pulmonary effort is normal.     Breath sounds: Normal breath sounds.  Abdominal:     General: Bowel sounds are normal.     Palpations: Abdomen is soft.  Skin:    Findings: Lesion (black pigmented macule under the nail of the right great toe) present.   Radiology: CXR on 04/12/2021: The heart, hila, and mediastinum are normal. There is a nodule in the left apex, unchanged since 2016, of no significance. No new nodules or masses. No focal infiltrates. No other acute abnormalities.  Laboratory examination:    Recent Labs    09/03/20 1446  NA 141  K 4.3  CL 103  CO2 23  GLUCOSE 87  BUN 15  CREATININE 0.92  CALCIUM 8.8  GFRNONAA 85  GFRAA 98   CMP Latest Ref Rng & Units 09/03/2020 08/07/2019 07/04/2019  Glucose 65 - 99 mg/dL 87 159(H) -  BUN 8 - 27 mg/dL 15 13 -  Creatinine 0.76 - 1.27 mg/dL 0.92 0.89 -  Sodium 134 - 144 mmol/L 141 141 -  Potassium 3.5 - 5.2 mmol/L 4.3 4.9 -  Chloride 96 - 106 mmol/L 103 104 -  CO2 20 - 29 mmol/L 23 26 -  Calcium 8.6 - 10.2 mg/dL 8.8 9.2 -  Total Protein 6.0 - 8.5 g/dL - - 6.2  Total Bilirubin 0.0 - 1.2 mg/dL - - 0.4  Alkaline Phos 39 - 117 IU/L - - 51  AST 0 - 40 IU/L - - 22  ALT 0 - 44 IU/L - - 19   CBC Latest Ref Rng & Units 01/23/2017  WBC 4.0 - 10.5 K/uL 7.8  Hemoglobin 13.0 - 17.0 g/dL 14.1  Hematocrit 39.0 - 52.0 % 41.4  Platelets 150 - 400 K/uL 141(L)   Lipid Panel     Component Value Date/Time   CHOL 123 07/04/2019 0904   TRIG 72  07/04/2019 0904   HDL 43 07/04/2019 0904   LDLCALC 65 07/04/2019 0904    External Labs: Cholesterol, total 161.000 m 01/18/2021 HDL 34.000 mg 01/18/2021 LDL 87.000 mg 01/18/2021 Triglycerides 236.000 m 01/18/2021  A1C 6.700 % 01/18/2021 TSH 2.220 04/11/2021  Hemoglobin 13.700 g/d 04/11/2021  Creatinine, Serum 0.930 mg/ 04/11/2021 Potassium 4.400 mm 04/11/2021 Magnesium N/D ALT (SGPT) 22.000 U/L 04/11/2021  Cholesterol, Total 131.000 12/02/2019 Triglycerides 232.000 12/02/2019 HDL 36.000 12/02/2019 LDL 49  A1C 6.500 12/02/2019  Hemoglobin 14.100 01/23/2017   Creatinine, Serum 0.830 12/02/2019 Potassium 4.200 12/02/2019 ALT (SGPT) 22.000 12/02/2019  Labs 05/22/2019: Serum glucose 131 mg, A1c 6.2%.  BUN 13, creatinine 0.90, eGFR greater than 60 mL, potassium 4.4, CMP normal.   Vitamin D normal, total cholesterol 172, triglycerides 83, HDL 40, LDL 115.  Non-HDL cholesterol 131.  PSA normal.  08/26/2018: Hemoglobin A1c 6.5%.  Vitamin D 66.4.  Cholesterol 185, triglycerides 123, HDL 36, LDL 125.  Medications   Current Outpatient Medications  Medication Instructions   albuterol (PROVENTIL HFA;VENTOLIN HFA) 108 (90 BASE) MCG/ACT inhaler 2 puffs, Inhalation, Every 6 hours PRN   amLODipine (NORVASC) 5 mg, Oral, Daily   aspirin 81 mg, Oral, Daily   atorvastatin (LIPITOR) 10 MG tablet Take 56m Tuesday and Thursday. All other days of the week take 146m   Azelastine-Fluticasone (DYMISTA) 137-50 MCG/ACT SUSP 1 spray, Nasal, 2 times daily before meals   carvedilol (COREG) 12.5 mg, Oral, 2 times daily   Cyanocobalamin (VITAMIN B 12 PO) 1 tablet, Oral, Daily   EPINEPHrine (EPI-PEN) 0.3 mg, Intramuscular, Once PRN   fluticasone (FLONASE) 50 MCG/ACT nasal spray 1 spray by Each Nare route.   glucose blood (ONETOUCH VERIO) test strip 1 strip.   hydrochlorothiazide (MICROZIDE) 12.5 mg, Oral, Every morning   metFORMIN (GLUCOPHAGE) 250 mg, Oral, 2 times daily with meals   montelukast (SINGULAIR) 10 mg, Oral,  Daily at bedtime   Multiple Vitamin (MULTIVITAMIN) tablet 1 tablet, Oral, Daily   nirmatrelvir/ritonavir EUA (PAXLOVID) TABS Take 3 tablets by mouth 2 times daily.   olmesartan (BENICAR) 40 mg, Oral, Every evening   ONETOUCH VERIO test strip 1 each, Daily   pantoprazole (PROTONIX) 40 MG tablet Daily   VITAMIN D PO 1,000 Units, Oral, Daily    Cardiac Studies:   Nuclear stress test  [1201-07-2015  1. The resting electrocardiogram demonstrated normal sinus rhythm, normal resting  conduction, no resting arrhythmias and normal rest repolarization. The stress electrocardiogram was at most equivocal for ischemia. There was 1 mm upsloping ST depression with exercise, back to baseline into recovery. Stress test terminated due to fatigue. Normal blood pressure response. The patient completed an estimated workload of 7.4 METS. 2. Myocardial perfusion imaging is normal. Overall left ventricular systolic function was normal without regional wall motion abnormalities. The left ventricular ejection fraction was 52%.  Cardiac cath 01/23/2017: Proximal LAD 30%, ostial ramus 40%, mid RCA mild disease. Normal LVEF.  EKG:  EKG 02/09/2020: Normal sinus rhythm with rate of 61 bpm, normal axis.  Poor R wave progression, probably normal variant.  No evidence of ischemia, normal EKG. No significant change from EKG 06/10/2019   Assessment     ICD-10-CM   1. Essential hypertension  I10 amLODipine (NORVASC) 5 MG tablet    2. Coronary artery disease involving native coronary artery of native heart without angina pectoris  I25.10     3. Nail lesion  L60.9 Ambulatory referral to Dermatology    4. Hypercholesteremia  E78.00          Recommendations:   MASTON WIGHT  is a 70 y.o. Asian [place] male with moderate coronary artery disease by angiogram on 01/23/2017, exertional chest pain, GERD presents for 6 month f/u.  Denies recurrence of angina since being on aggressive medical therapy.   His blood  pressure has been elevated at home and in the office today. Patient should continue Olmesartan and HCTZ at current doses. Patient reports urinary frequency with HCTZ, so HCTZ dose will not be changed. Amlodipine 5mg  will be added. Patient instructed that his goal BP is less than 130/80. The patient was instructed to call the office if he experiences signs of hypotension like dizziness or fainting. The patient should continue monitoring his BP at home.   External labs reviewed. LDL normal on 01/18/2021. Triglycerides remain elevated at 236. Continue Atorvastatin 10mg .  His LDL was well controlled previously at <70.  In view of diabetic state and also underlying CAD, would recommend continue to closely monitor this.  Patient encouraged to take Metamucil every day to help with any constipation that may develop while he takes an iron supplement. Doing this will also help lower his cholesterol.  If lipids do not improve, consider switching from Lipitor to Crestor 20 mg daily.  Patient was reassured that his COVID fatigue will likely go away with time. Continue to monitor.   Concerned about dark lesion under the right great toenail. Image added to chart. Dermatology referral sent for urgent referral to exclude malignant melanoma.   I will see him back in 6 months for follow-up. He likes to see me on a 55-month basis.     [place], PA-S 05/09/2021, 1:03 PM Office: [phone] Pager: [phone]   Patient seen and examined in conjunction with Ms. Adline Potter, [place] second year Ship broker at [employer].  Time spent is in direct patient face to face encounter not including the teaching and training involved.    Adrian Prows, MD, [facility] 05/09/2021, 5:42 PM Office: [phone]

## 2021-05-09 NOTE — Progress Notes (Deleted)
Primary Physician/Referring:  Maurice Small, MD  Patient ID: Aaron David, male    DOB: 09-07-1951, 70 y.o.   MRN: [id number]  No chief complaint on file.  HPI:    Aaron David  is a 70 y.o. Asian [place] male with moderate coronary artery disease by angiogram on 01/23/2017, exertional chest pain, GERD presents for 6 month f/u.  He is now taking statins regularly.  Denies recurrence of angina since being on aggressive medical therapy.  Patient states that he has been doing well. He does endorse persistent fatigue since he had COVID 2 months ago. He states that he had a follow up CXR two weeks ago which looked normal.   The patient has continued to monitor his BP at home. He states that his average readings are around 130/85-90. He is taking Olmesartan and HCTZ. He states that the HCTZ causes him to urinate frequently. Elevated BP in office of 137/86. BP was rechecked once the patient was settled in the room and was found to be 142/84.   The patient was recently placed on an iron supplement by his PCP.   Patient is concerned about a dark spot that appeared on his left great toe.  Past Medical History:  Diagnosis Date   Acid reflux    Borderline diabetes    Headache    Past Surgical History:  Procedure Laterality Date   LEFT HEART CATH AND CORONARY ANGIOGRAPHY N/A 01/23/2017   Procedure: Left Heart Cath and Coronary Angiography;  Surgeon: Adrian Prows, MD;  Location: [facility];  Service: Cardiovascular;  Laterality: N/A;   Social History   Tobacco Use   Smoking status: Never   Smokeless tobacco: Never  Substance Use Topics   Alcohol use: No    Alcohol/week: 0.0 standard drinks   Marital Status: Married  ROS  Review of Systems  Cardiovascular:  Negative for chest pain, dyspnea on exertion and leg swelling.  Skin:  Positive for nail changes (on right great toe).  Gastrointestinal:  Negative for melena.  Genitourinary:  Positive for frequency (when on HCTZ).   All other systems reviewed and are negative. Objective   Vitals with BMI 09/24/2020 09/24/2020 08/12/2020  Height - _0  _1   Weight - 134 lbs 131 lbs  BMI - 29.47 65.46  Systolic 503 546 568  Diastolic 87 78 83  Pulse - 76 75    Physical Exam Cardiovascular:     Rate and Rhythm: Normal rate and regular rhythm.     Pulses: Intact distal pulses.     Heart sounds: Normal heart sounds. No murmur heard.   No gallop.     Comments: No leg edema, no JVD. Pulmonary:     Effort: Pulmonary effort is normal.     Breath sounds: Normal breath sounds.  Abdominal:     General: Bowel sounds are normal.     Palpations: Abdomen is soft.  Skin:    Findings: Lesion (black pigmented macule under the nail of the right great toe) present.   Radiology: CXR on 04/12/2021: The heart, hila, and mediastinum are normal. There is a nodule in the left apex, unchanged since 2016, of no significance. No new nodules or masses. No focal infiltrates. No other acute abnormalities.  Laboratory examination:    Recent Labs    09/03/20 1446  NA 141  K 4.3  CL 103  CO2 23  GLUCOSE 87  BUN 15  CREATININE 0.92  CALCIUM 8.8  GFRNONAA 85  GFRAA 98   CMP Latest Ref Rng & Units 09/03/2020 08/07/2019 07/04/2019  Glucose 65 - 99 mg/dL 87 159(H) -  BUN 8 - 27 mg/dL 15 13 -  Creatinine 0.76 - 1.27 mg/dL 0.92 0.89 -  Sodium 134 - 144 mmol/L 141 141 -  Potassium 3.5 - 5.2 mmol/L 4.3 4.9 -  Chloride 96 - 106 mmol/L 103 104 -  CO2 20 - 29 mmol/L 23 26 -  Calcium 8.6 - 10.2 mg/dL 8.8 9.2 -  Total Protein 6.0 - 8.5 g/dL - - 6.2  Total Bilirubin 0.0 - 1.2 mg/dL - - 0.4  Alkaline Phos 39 - 117 IU/L - - 51  AST 0 - 40 IU/L - - 22  ALT 0 - 44 IU/L - - 19   CBC Latest Ref Rng & Units 01/23/2017  WBC 4.0 - 10.5 K/uL 7.8  Hemoglobin 13.0 - 17.0 g/dL 14.1  Hematocrit 39.0 - 52.0 % 41.4  Platelets 150 - 400 K/uL 141(L)   Lipid Panel     Component Value Date/Time   CHOL 123 07/04/2019 0904   TRIG 72  07/04/2019 0904   HDL 43 07/04/2019 0904   LDLCALC 65 07/04/2019 0904    External Labs: Cholesterol, total 161.000 m 01/18/2021 HDL 34.000 mg 01/18/2021 LDL 87.000 mg 01/18/2021 Triglycerides 236.000 m 01/18/2021  A1C 6.700 % 01/18/2021 TSH 2.220 04/11/2021  Hemoglobin 13.700 g/d 04/11/2021  Creatinine, Serum 0.930 mg/ 04/11/2021 Potassium 4.400 mm 04/11/2021 Magnesium N/D ALT (SGPT) 22.000 U/L 04/11/2021  Cholesterol, Total 131.000 12/02/2019 Triglycerides 232.000 12/02/2019 HDL 36.000 12/02/2019 LDL 49  A1C 6.500 12/02/2019  Hemoglobin 14.100 01/23/2017   Creatinine, Serum 0.830 12/02/2019 Potassium 4.200 12/02/2019 ALT (SGPT) 22.000 12/02/2019  Labs 05/22/2019: Serum glucose 131 mg, A1c 6.2%.  BUN 13, creatinine 0.90, eGFR greater than 60 mL, potassium 4.4, CMP normal.   Vitamin D normal, total cholesterol 172, triglycerides 83, HDL 40, LDL 115.  Non-HDL cholesterol 131.  PSA normal.  08/26/2018: Hemoglobin A1c 6.5%.  Vitamin D 66.4.  Cholesterol 185, triglycerides 123, HDL 36, LDL 125.  Medications   Current Outpatient Medications  Medication Instructions   albuterol (PROVENTIL HFA;VENTOLIN HFA) 108 (90 BASE) MCG/ACT inhaler 2 puffs, Inhalation, Every 6 hours PRN   amLODipine (NORVASC) 5 mg, Oral, Daily   aspirin 81 mg, Oral, Daily   atorvastatin (LIPITOR) 10 MG tablet Take 56m Tuesday and Thursday. All other days of the week take 146m   Azelastine-Fluticasone (DYMISTA) 137-50 MCG/ACT SUSP 1 spray, Nasal, 2 times daily before meals   carvedilol (COREG) 12.5 mg, Oral, 2 times daily   Cyanocobalamin (VITAMIN B 12 PO) 1 tablet, Oral, Daily   EPINEPHrine (EPI-PEN) 0.3 mg, Intramuscular, Once PRN   fluticasone (FLONASE) 50 MCG/ACT nasal spray 1 spray by Each Nare route.   glucose blood (ONETOUCH VERIO) test strip 1 strip.   hydrochlorothiazide (MICROZIDE) 12.5 mg, Oral, Every morning   metFORMIN (GLUCOPHAGE) 250 mg, Oral, 2 times daily with meals   montelukast (SINGULAIR) 10 mg, Oral,  Daily at bedtime   Multiple Vitamin (MULTIVITAMIN) tablet 1 tablet, Oral, Daily   nirmatrelvir/ritonavir EUA (PAXLOVID) TABS Take 3 tablets by mouth 2 times daily.   olmesartan (BENICAR) 40 mg, Oral, Every evening   ONETOUCH VERIO test strip 1 each, Daily   pantoprazole (PROTONIX) 40 MG tablet Daily   VITAMIN D PO 1,000 Units, Oral, Daily    Cardiac Studies:   Nuclear stress test  [1201-07-2015  1. The resting electrocardiogram demonstrated normal sinus rhythm, normal resting  conduction, no resting arrhythmias and normal rest repolarization. The stress electrocardiogram was at most equivocal for ischemia. There was 1 mm upsloping ST depression with exercise, back to baseline into recovery. Stress test terminated due to fatigue. Normal blood pressure response. The patient completed an estimated workload of 7.4 METS. 2. Myocardial perfusion imaging is normal. Overall left ventricular systolic function was normal without regional wall motion abnormalities. The left ventricular ejection fraction was 52%.  Cardiac cath 01/23/2017: Proximal LAD 30%, ostial ramus 40%, mid RCA mild disease. Normal LVEF.  EKG:  EKG 02/09/2020: Normal sinus rhythm with rate of 61 bpm, normal axis.  Poor R wave progression, probably normal variant.  No evidence of ischemia, normal EKG. No significant change from EKG 06/10/2019   Assessment     ICD-10-CM   1. Essential hypertension  I10 amLODipine (NORVASC) 5 MG tablet    2. Coronary artery disease involving native coronary artery of native heart without angina pectoris  I25.10     3. Nail lesion  L60.9 Ambulatory referral to Dermatology    4. Hypercholesteremia  E78.00       Recommendations:   Aaron David  is a 70 y.o. Asian [place] male with moderate coronary artery disease by angiogram on 01/23/2017, exertional chest pain, GERD presents for 6 month f/u.  Denies recurrence of angina since being on aggressive medical therapy.   His blood pressure has  been elevated at home and in the office today. Patient should continue Olmesartan and HCTZ at current doses. Patient reports urinary frequency with HCTZ, so HCTZ dose will not be changed. Amlodipine 70m will be added. Patient instructed that his goal BP is less than 130/80. The patient was instructed to call the office if he experiences signs of hypotension like dizziness or fainting. The patient should continue monitoring his BP at home.   External labs reviewed. LDL normal on 01/18/2021. Triglycerides remain elevated at 236. Continue Atorvastatin 141m    Patient encouraged to take Metamucil every day to help with any constipation that may develop while he takes an iron supplement. Doing this will also help lower his cholesterol.   Patient was reassured that his COVID fatigue will likely go away with time. Continue to monitor.   Concerned about dark lesion under the right great toenail. Image added to chart. Dermatology referral sent.   I will see him back in 6 months for follow-up. He likes to see me on a 6-25-monthsis.     JayAdrian ProwsA-S 05/09/2021, 5:43 PM Office: [phone]: [phone]

## 2021-05-09 NOTE — Progress Notes (Signed)
Primary Physician/Referring:  Maurice Small, MD  Patient ID: Aaron David, male    DOB: 02-28-1951, 70 y.o.   MRN: [id number]  Chief Complaint  Patient presents with   Coronary Artery Disease   Hyperlipidemia    HPI:    Aaron David  is a 70 y.o. Asian [place] male with moderate coronary artery disease by angiogram on 01/23/2017, exertional chest pain, GERD presents for 6 month f/u.  He is now taking statins regularly.  Denies recurrence of angina since being on aggressive medical therapy.  Patient states that he has been doing well. He does endorse persistent fatigue since he had COVID 2 months ago. He states that he had a follow up CXR two weeks ago which looked normal.   The patient has continued to monitor his BP at home. He states that his average readings are around 130/85-90. He is taking Olmesartan and HCTZ. He states that the HCTZ causes him to urinate frequently. Elevated BP in office of 137/86. BP was rechecked once the patient was settled in the room and was found to be 142/84.   The patient was recently placed on an iron supplement by his PCP.   Patient is concerned about a dark spot that appeared on his left great toe.  Past Medical History:  Diagnosis Date   Acid reflux    Borderline diabetes    Headache    Past Surgical History:  Procedure Laterality Date   LEFT HEART CATH AND CORONARY ANGIOGRAPHY N/A 01/23/2017   Procedure: Left Heart Cath and Coronary Angiography;  Surgeon: Adrian Prows, MD;  Location: [facility];  Service: Cardiovascular;  Laterality: N/A;   Social History   Tobacco Use   Smoking status: Never   Smokeless tobacco: Never  Substance Use Topics   Alcohol use: No    Alcohol/week: 0.0 standard drinks   Marital Status: Married  ROS  Review of Systems  Cardiovascular:  Negative for chest pain, dyspnea on exertion and leg swelling.  Skin:  Positive for nail changes (on right great toe).  Gastrointestinal:  Negative for  melena.  Genitourinary:  Positive for frequency (when on HCTZ).  All other systems reviewed and are negative. Objective   Vitals with BMI 09/24/2020 09/24/2020 08/12/2020  Height - _0  _1   Weight - 134 lbs 131 lbs  BMI - 62.13 08.65  Systolic 784 696 295  Diastolic 87 78 83  Pulse - 76 75     Physical Exam Cardiovascular:     Rate and Rhythm: Normal rate and regular rhythm.     Pulses: Intact distal pulses.     Heart sounds: Normal heart sounds. No murmur heard.   No gallop.     Comments: No leg edema, no JVD. Pulmonary:     Effort: Pulmonary effort is normal.     Breath sounds: Normal breath sounds.  Abdominal:     General: Bowel sounds are normal.     Palpations: Abdomen is soft.  Skin:    Findings: Lesion (black pigmented macule under the nail of the right great toe) present.   Radiology: CXR on 04/12/2021: The heart, hila, and mediastinum are normal. There is a nodule in the left apex, unchanged since 2016, of no significance. No new nodules or masses. No focal infiltrates. No other acute abnormalities.  Laboratory examination:    Recent Labs    09/03/20 1446  NA 141  K 4.3  CL 103  CO2 23  GLUCOSE 87  BUN 15  CREATININE 0.92  CALCIUM 8.8  GFRNONAA 85  GFRAA 98    CMP Latest Ref Rng & Units 09/03/2020 08/07/2019 07/04/2019  Glucose 65 - 99 mg/dL 87 159(H) -  BUN 8 - 27 mg/dL 15 13 -  Creatinine 0.76 - 1.27 mg/dL 0.92 0.89 -  Sodium 134 - 144 mmol/L 141 141 -  Potassium 3.5 - 5.2 mmol/L 4.3 4.9 -  Chloride 96 - 106 mmol/L 103 104 -  CO2 20 - 29 mmol/L 23 26 -  Calcium 8.6 - 10.2 mg/dL 8.8 9.2 -  Total Protein 6.0 - 8.5 g/dL - - 6.2  Total Bilirubin 0.0 - 1.2 mg/dL - - 0.4  Alkaline Phos 39 - 117 IU/L - - 51  AST 0 - 40 IU/L - - 22  ALT 0 - 44 IU/L - - 19   CBC Latest Ref Rng & Units 01/23/2017  WBC 4.0 - 10.5 K/uL 7.8  Hemoglobin 13.0 - 17.0 g/dL 14.1  Hematocrit 39.0 - 52.0 % 41.4  Platelets 150 - 400 K/uL 141(L)   Lipid Panel      Component Value Date/Time   CHOL 123 07/04/2019 0904   TRIG 72 07/04/2019 0904   HDL 43 07/04/2019 0904   LDLCALC 65 07/04/2019 0904    External Labs: Cholesterol, total 161.000 m 01/18/2021 HDL 34.000 mg 01/18/2021 LDL 87.000 mg 01/18/2021 Triglycerides 236.000 m 01/18/2021  A1C 6.700 % 01/18/2021 TSH 2.220 04/11/2021  Hemoglobin 13.700 g/d 04/11/2021  Creatinine, Serum 0.930 mg/ 04/11/2021 Potassium 4.400 mm 04/11/2021 Magnesium N/D ALT (SGPT) 22.000 U/L 04/11/2021  Cholesterol, Total 131.000 12/02/2019 Triglycerides 232.000 12/02/2019 HDL 36.000 12/02/2019 LDL 49  A1C 6.500 12/02/2019  Hemoglobin 14.100 01/23/2017   Creatinine, Serum 0.830 12/02/2019 Potassium 4.200 12/02/2019 ALT (SGPT) 22.000 12/02/2019  Labs 05/22/2019: Serum glucose 131 mg, A1c 6.2%.  BUN 13, creatinine 0.90, eGFR greater than 60 mL, potassium 4.4, CMP normal.   Vitamin D normal, total cholesterol 172, triglycerides 83, HDL 40, LDL 115.  Non-HDL cholesterol 131.  PSA normal.  08/26/2018: Hemoglobin A1c 6.5%.  Vitamin D 66.4.  Cholesterol 185, triglycerides 123, HDL 36, LDL 125.  Medications   Current Outpatient Medications  Medication Instructions   albuterol (PROVENTIL HFA;VENTOLIN HFA) 108 (90 BASE) MCG/ACT inhaler 2 puffs, Inhalation, Every 6 hours PRN   amLODipine (NORVASC) 5 mg, Oral, Daily   aspirin 81 mg, Oral, Daily   atorvastatin (LIPITOR) 10 MG tablet Take 48m Tuesday and Thursday. All other days of the week take 171m   Azelastine-Fluticasone (DYMISTA) 137-50 MCG/ACT SUSP 1 spray, Nasal, 2 times daily before meals   carvedilol (COREG) 12.5 mg, Oral, 2 times daily   Cyanocobalamin (VITAMIN B 12 PO) 1 tablet, Oral, Daily   EPINEPHrine (EPI-PEN) 0.3 mg, Intramuscular, Once PRN   fluticasone (FLONASE) 50 MCG/ACT nasal spray 1 spray by Each Nare route.   glucose blood (ONETOUCH VERIO) test strip 1 strip.   hydrochlorothiazide (MICROZIDE) 12.5 mg, Oral, Every morning   metFORMIN (GLUCOPHAGE) 250 mg, Oral,  2 times daily with meals   montelukast (SINGULAIR) 10 mg, Oral, Daily at bedtime   Multiple Vitamin (MULTIVITAMIN) tablet 1 tablet, Oral, Daily   nirmatrelvir/ritonavir EUA (PAXLOVID) TABS Take 3 tablets by mouth 2 times daily.   olmesartan (BENICAR) 40 mg, Oral, Every evening   ONETOUCH VERIO test strip 1 each, Daily   pantoprazole (PROTONIX) 40 MG tablet Daily   VITAMIN D PO 1,000 Units, Oral, Daily    Cardiac Studies:   Nuclear stress test  [  10/21/2014]:  1. The resting electrocardiogram demonstrated normal sinus rhythm, normal resting conduction, no resting arrhythmias and normal rest repolarization. The stress electrocardiogram was at most equivocal for ischemia. There was 1 mm upsloping ST depression with exercise, back to baseline into recovery. Stress test terminated due to fatigue. Normal blood pressure response. The patient completed an estimated workload of 7.4 METS. 2. Myocardial perfusion imaging is normal. Overall left ventricular systolic function was normal without regional wall motion abnormalities. The left ventricular ejection fraction was 52%.  Cardiac cath 01/23/2017: Proximal LAD 30%, ostial ramus 40%, mid RCA mild disease. Normal LVEF.  EKG:  EKG 02/09/2020: Normal sinus rhythm with rate of 61 bpm, normal axis.  Poor R wave progression, probably normal variant.  No evidence of ischemia, normal EKG. No significant change from EKG 06/10/2019   Assessment     ICD-10-CM   1. Essential hypertension  I10 amLODipine (NORVASC) 5 MG tablet    2. Coronary artery disease involving native coronary artery of native heart without angina pectoris  I25.10     3. Nail lesion  L60.9 Ambulatory referral to Dermatology    4. Hypercholesteremia  E78.00          Recommendations:   Aaron David  is a 70 y.o. Asian [place] male with moderate coronary artery disease by angiogram on 01/23/2017, exertional chest pain, GERD presents for 6 month f/u.  Denies recurrence of  angina since being on aggressive medical therapy.   His blood pressure has been elevated at home and in the office today. Patient should continue Olmesartan and HCTZ at current doses. Patient reports urinary frequency with HCTZ, so HCTZ dose will not be changed. Amlodipine 43m will be added. Patient instructed that his goal BP is less than 130/80. The patient was instructed to call the office if he experiences signs of hypotension like dizziness or fainting. The patient should continue monitoring his BP at home.   External labs reviewed. LDL normal on 01/18/2021. Triglycerides remain elevated at 236. Continue Atorvastatin 143m  His LDL was well controlled previously at <70.  In view of diabetic state and also underlying CAD, would recommend continue to closely monitor this.  Patient encouraged to take Metamucil every day to help with any constipation that may develop while he takes an iron supplement. Doing this will also help lower his cholesterol.  If lipids do not improve, consider switching from Lipitor to Crestor 20 mg daily.  Patient was reassured that his COVID fatigue will likely go away with time. Continue to monitor.   Concerned about dark lesion under the right great toenail. Image added to chart. Dermatology referral sent for urgent referral to exclude malignant melanoma.   I will see him back in 6 months for follow-up. He likes to see me on a 6-52-monthsis.     SumWorthA-S 05/09/2021, 5:53 PM Office: [phone] seen and examined in conjunction with Ms. SumAdline PotterA Utahcond year stuShip broker EloBecton, Dickinson and CompanyTime spent is in direct patient face to face encounter not including the teaching and training involved.    JayAdrian ProwsD, FACRiverside Community Hospital18/2022, 5:53 PM Office: [phone]

## 2021-05-25 ENCOUNTER — Other Ambulatory Visit: Payer: Self-pay | Admitting: Family Medicine

## 2021-05-25 ENCOUNTER — Other Ambulatory Visit: Payer: Self-pay

## 2021-05-25 ENCOUNTER — Ambulatory Visit
Admission: RE | Admit: 2021-05-25 | Discharge: 2021-05-25 | Disposition: A | Discharge status: Home / Self Care | Payer: Medicare Other | Source: Ambulatory Visit | Attending: Family Medicine | Admitting: Family Medicine

## 2021-05-25 DIAGNOSIS — R0781 Pleurodynia: Secondary | ICD-10-CM | POA: Diagnosis not present

## 2021-05-25 DIAGNOSIS — S299XXA Unspecified injury of thorax, initial encounter: Secondary | ICD-10-CM

## 2021-05-25 DIAGNOSIS — M419 Scoliosis, unspecified: Secondary | ICD-10-CM | POA: Diagnosis not present

## 2021-05-25 DIAGNOSIS — R911 Solitary pulmonary nodule: Secondary | ICD-10-CM | POA: Diagnosis not present

## 2021-05-26 DIAGNOSIS — L853 Xerosis cutis: Secondary | ICD-10-CM | POA: Diagnosis not present

## 2021-05-26 DIAGNOSIS — D485 Neoplasm of uncertain behavior of skin: Secondary | ICD-10-CM | POA: Diagnosis not present

## 2021-05-26 DIAGNOSIS — S90222A Contusion of left lesser toe(s) with damage to nail, initial encounter: Secondary | ICD-10-CM | POA: Diagnosis not present

## 2021-05-26 DIAGNOSIS — L309 Dermatitis, unspecified: Secondary | ICD-10-CM | POA: Diagnosis not present

## 2021-07-27 DIAGNOSIS — I2510 Atherosclerotic heart disease of native coronary artery without angina pectoris: Secondary | ICD-10-CM | POA: Diagnosis not present

## 2021-07-27 DIAGNOSIS — E1169 Type 2 diabetes mellitus with other specified complication: Secondary | ICD-10-CM | POA: Diagnosis not present

## 2021-07-27 DIAGNOSIS — Z0000 Encounter for general adult medical examination without abnormal findings: Secondary | ICD-10-CM | POA: Diagnosis not present

## 2021-07-27 DIAGNOSIS — E559 Vitamin D deficiency, unspecified: Secondary | ICD-10-CM | POA: Diagnosis not present

## 2021-07-27 DIAGNOSIS — E782 Mixed hyperlipidemia: Secondary | ICD-10-CM | POA: Diagnosis not present

## 2021-07-27 DIAGNOSIS — I6789 Other cerebrovascular disease: Secondary | ICD-10-CM | POA: Diagnosis not present

## 2021-07-27 DIAGNOSIS — L608 Other nail disorders: Secondary | ICD-10-CM | POA: Diagnosis not present

## 2021-07-27 DIAGNOSIS — Z1159 Encounter for screening for other viral diseases: Secondary | ICD-10-CM | POA: Diagnosis not present

## 2021-07-27 DIAGNOSIS — I10 Essential (primary) hypertension: Secondary | ICD-10-CM | POA: Diagnosis not present

## 2021-07-27 DIAGNOSIS — Z79899 Other long term (current) drug therapy: Secondary | ICD-10-CM | POA: Diagnosis not present

## 2021-07-27 DIAGNOSIS — E119 Type 2 diabetes mellitus without complications: Secondary | ICD-10-CM | POA: Diagnosis not present

## 2021-07-27 DIAGNOSIS — E611 Iron deficiency: Secondary | ICD-10-CM | POA: Diagnosis not present

## 2021-08-31 DIAGNOSIS — R059 Cough, unspecified: Secondary | ICD-10-CM | POA: Diagnosis not present

## 2021-10-03 ENCOUNTER — Other Ambulatory Visit: Payer: Self-pay | Admitting: Cardiology

## 2021-10-03 DIAGNOSIS — I25118 Atherosclerotic heart disease of native coronary artery with other forms of angina pectoris: Secondary | ICD-10-CM

## 2021-10-03 DIAGNOSIS — I10 Essential (primary) hypertension: Secondary | ICD-10-CM

## 2021-10-03 MED ORDER — HYDROCHLOROTHIAZIDE 12.5 MG PO CAPS: 12.5000 mg | ORAL_CAPSULE | Freq: Every morning | ORAL | 3 refills | Status: DC

## 2021-10-03 MED ORDER — CARVEDILOL 12.5 MG PO TABS: 12.5000 mg | ORAL_TABLET | Freq: Two times a day (BID) | ORAL | 3 refills | Status: DC

## 2021-10-03 MED ORDER — AMLODIPINE BESYLATE 5 MG PO TABS: 5.0000 mg | ORAL_TABLET | Freq: Every day | ORAL | 3 refills | Status: DC

## 2021-10-20 DIAGNOSIS — E119 Type 2 diabetes mellitus without complications: Secondary | ICD-10-CM | POA: Diagnosis not present

## 2021-10-27 ENCOUNTER — Telehealth: Payer: Self-pay | Admitting: Cardiology

## 2021-10-27 NOTE — Telephone Encounter (Signed)
Patient called me stating that his blood pressure has been running relatively low with occasional episodes of dizziness, systolic blood pressure has been around 110 to 120 mmHg.  Advised him to discontinue hydrochlorothiazide for now.   Medications Discontinued During This Encounter  Medication Reason   hydrochlorothiazide (MICROZIDE) 12.5 MG capsule Discontinued by provider     Adrian Prows, MD, [facility] 10/27/2021, 3:52 PM Office: [phone] Fax: [phone] Pager: [phone]

## 2021-11-09 ENCOUNTER — Ambulatory Visit: Payer: Medicare Other | Admitting: Cardiology

## 2021-11-09 ENCOUNTER — Other Ambulatory Visit: Payer: Self-pay

## 2021-11-09 ENCOUNTER — Encounter: Payer: Self-pay | Admitting: Cardiology

## 2021-11-09 VITALS — BP 126/76 | HR 78 | Temp 97.9°F | Resp 17 | Ht 68.0 in | Wt 129.2 lb

## 2021-11-09 DIAGNOSIS — I25118 Atherosclerotic heart disease of native coronary artery with other forms of angina pectoris: Secondary | ICD-10-CM

## 2021-11-09 DIAGNOSIS — E118 Type 2 diabetes mellitus with unspecified complications: Secondary | ICD-10-CM | POA: Diagnosis not present

## 2021-11-09 DIAGNOSIS — I10 Essential (primary) hypertension: Secondary | ICD-10-CM | POA: Diagnosis not present

## 2021-11-09 DIAGNOSIS — E7800 Pure hypercholesterolemia, unspecified: Secondary | ICD-10-CM

## 2021-11-09 NOTE — Progress Notes (Signed)
Primary Physician/Referring:  [name], MD  Patient ID: Aaron David, male    DOB: 03-02-1951, 71 y.o.   MRN: [id number]  Chief Complaint  Patient presents with   Follow-up    6 MONTH   Hypertension   Coronary Artery Disease   HPI:    Aaron David  is a 71 y.o. Asian [place] male with moderate coronary artery disease by angiogram on 01/23/2017, exertional chest pain, GERD.  Is a 42-monthoffice visit.  He remains asymptomatic but would like to see me on a 6 monthly basis.  Past Medical History:  Diagnosis Date   Acid reflux    Borderline diabetes    Headache    Past Surgical History:  Procedure Laterality Date   LEFT HEART CATH AND CORONARY ANGIOGRAPHY N/A 01/23/2017   Procedure: Left Heart Cath and Coronary Angiography;  Surgeon: JAdrian Prows MD;  [facility];  Service: Cardiovascular;  Laterality: N/A;   Family History  Problem Relation Age of Onset   Diabetes Mother    Heart disease Mother    Asthma Father    Hypertension Sister    Heart disease Brother    Diabetes Sister    Diabetes Brother    Diabetes Brother    Heart disease Brother    Diabetes Brother    Diabetes Brother    Social History   Tobacco Use   Smoking status: Never   Smokeless tobacco: Never  Substance Use Topics   Alcohol use: No    Alcohol/week: 0.0 standard drinks   Marital Status: Married  ROS  Review of Systems  Cardiovascular:  Negative for chest pain, dyspnea on exertion and leg swelling.  Gastrointestinal:  Negative for melena.  Objective   Vitals with BMI 11/09/2021 05/10/2021 05/10/2021  Height '5\' 8"'  - '5\' 8"'   Weight 129 lbs 3 oz - 130 lbs  BMI 196.22- 129.79 Systolic [id number] Diastolic 76 86 84  Pulse 78 82 78   Physical Exam Cardiovascular:     Rate and Rhythm: Normal rate and regular rhythm.     Pulses: Intact distal pulses.     Heart sounds: Normal heart sounds. No murmur heard.   No gallop.     Comments: No leg edema, no  JVD. Pulmonary:     Effort: Pulmonary effort is normal.     Breath sounds: Normal breath sounds.  Abdominal:     General: Bowel sounds are normal.     Palpations: Abdomen is soft.   Laboratory examination:    No results for input(s): NA, K, CL, CO2, GLUCOSE, BUN, CREATININE, CALCIUM, GFRNONAA, GFRAA in the last 8760 hours.  CMP Latest Ref Rng & Units 09/03/2020 08/07/2019 07/04/2019  Glucose 65 - 99 mg/dL 87 159(H) -  BUN 8 - 27 mg/dL 15 13 -  Creatinine 0.76 - 1.27 mg/dL 0.92 0.89 -  Sodium 134 - 144 mmol/L 141 141 -  Potassium 3.5 - 5.2 mmol/L 4.3 4.9 -  Chloride 96 - 106 mmol/L 103 104 -  CO2 20 - 29 mmol/L 23 26 -  Calcium 8.6 - 10.2 mg/dL 8.8 9.2 -  Total Protein 6.0 - 8.5 g/dL - - 6.2  Total Bilirubin 0.0 - 1.2 mg/dL - - 0.4  Alkaline Phos 39 - 117 IU/L - - 51  AST 0 - 40 IU/L - - 22  ALT 0 - 44 IU/L - - 19   CBC Latest Ref Rng & Units 01/23/2017  WBC  4.0 - 10.5 K/uL 7.8  Hemoglobin 13.0 - 17.0 g/dL 14.1  Hematocrit 39.0 - 52.0 % 41.4  Platelets 150 - 400 K/uL 141(L)   Lipid Panel     Component Value Date/Time   CHOL 123 07/04/2019 0904   TRIG 72 07/04/2019 0904   HDL 43 07/04/2019 0904   LDLCALC 65 07/04/2019 0904    External Labs:   Labs 07/27/2021:  A1c 7.0%.  TSH normal.  Hb 14.6/HCT 43.8, platelets 168.  Serum glucose 174 mg, BUN 10, creatinine 0.88, EGFR 93 mL, potassium 5.3.  CMP otherwise normal.  Vitamin D normal.  46.3.  02/15/2021: Total cholesterol 135, triglycerides 115, HDL 45, LDL 68  Cholesterol, total 161.000 m 01/18/2021 HDL 34.000 mg 01/18/2021 LDL 87.000 mg 01/18/2021 Triglycerides 236.000 m 01/18/2021  A1C 6.700 % 01/18/2021 TSH 2.220 04/11/2021  Hemoglobin 13.700 g/d 04/11/2021  Creatinine, Serum 0.930 mg/ 04/11/2021 Potassium 4.400 mm 04/11/2021 Magnesium N/D ALT (SGPT) 22.000 U/L 04/11/2021  Cholesterol, Total 131.000 12/02/2019 Triglycerides 232.000 12/02/2019 HDL 36.000 12/02/2019 LDL 49  A1C 6.500 12/02/2019  Hemoglobin 14.100  01/23/2017   Creatinine, Serum 0.830 12/02/2019 Potassium 4.200 12/02/2019 ALT (SGPT) 22.000 12/02/2019  Labs 05/22/2019: Serum glucose 131 mg, A1c 6.2%.  BUN 13, creatinine 0.90, eGFR greater than 60 mL, potassium 4.4, CMP normal.   Vitamin D normal, total cholesterol 172, triglycerides 83, HDL 40, LDL 115.  Non-HDL cholesterol 131.  PSA normal.  08/26/2018: Hemoglobin A1c 6.5%.  Vitamin D 66.4.  Cholesterol 185, triglycerides 123, HDL 36, LDL 125.  Allergies   Allergies  Allergen Reactions   Penicillins Rash    itching    Medications Prior to Visit:   Outpatient Medications Prior to Visit  Medication Sig Dispense Refill   albuterol (PROVENTIL HFA;VENTOLIN HFA) 108 (90 BASE) MCG/ACT inhaler Inhale 2 puffs into the lungs every 6 (six) hours as needed for wheezing or shortness of breath.     amLODipine (NORVASC) 5 MG tablet Take 1 tablet (5 mg total) by mouth daily. 100 tablet 3   aspirin 81 MG tablet Take 81 mg by mouth daily.     atorvastatin (LIPITOR) 40 MG tablet Take 1 tablet by mouth daily.     Azelastine-Fluticasone 137-50 MCG/ACT SUSP Place 1 spray into the nose as needed.     carvedilol (COREG) 12.5 MG tablet Take 1 tablet (12.5 mg total) by mouth 2 (two) times daily. 200 tablet 3   Cyanocobalamin (VITAMIN B 12 PO) Take 1 tablet by mouth daily.     EPINEPHrine 0.3 mg/0.3 mL IJ SOAJ injection Inject 0.3 mg into the muscle once as needed.  1   Ferrous Sulfate (IRON) 325 (65 Fe) MG TABS Take 1 tablet by mouth daily.     fluticasone (FLONASE) 50 MCG/ACT nasal spray 1 spray by Each Nare route.     glucose blood (ONETOUCH VERIO) test strip 1 strip.     metFORMIN (GLUCOPHAGE) 500 MG tablet Take 250 mg by mouth 2 (two) times daily with a meal.     montelukast (SINGULAIR) 10 MG tablet Take 10 mg by mouth at bedtime.      Multiple Vitamin (MULTIVITAMIN) tablet Take 1 tablet by mouth daily.     olmesartan (BENICAR) 40 MG tablet Take 40 mg by mouth every evening. 30 tablet 2   ONETOUCH VERIO  test strip 1 each daily.     pantoprazole (PROTONIX) 40 MG tablet daily.     triamcinolone cream (KENALOG) 0.1 % Apply 1 application topically as needed.  VITAMIN D PO Take 1,000 Units by mouth daily.     atorvastatin (LIPITOR) 10 MG tablet Take 82m Tuesday and Thursday. All other days of the week take 117m 30 tablet 3   nirmatrelvir/ritonavir EUA (PAXLOVID) TABS Take 3 tablets by mouth 2 times daily. 30 tablet 0   No facility-administered medications prior to visit.   Final Medications at End of Visit    Current Meds  Medication Sig   albuterol (PROVENTIL HFA;VENTOLIN HFA) 108 (90 BASE) MCG/ACT inhaler Inhale 2 puffs into the lungs every 6 (six) hours as needed for wheezing or shortness of breath.   amLODipine (NORVASC) 5 MG tablet Take 1 tablet (5 mg total) by mouth daily.   aspirin 81 MG tablet Take 81 mg by mouth daily.   atorvastatin (LIPITOR) 40 MG tablet Take 1 tablet by mouth daily.   Azelastine-Fluticasone 137-50 MCG/ACT SUSP Place 1 spray into the nose as needed.   carvedilol (COREG) 12.5 MG tablet Take 1 tablet (12.5 mg total) by mouth 2 (two) times daily.   Cyanocobalamin (VITAMIN B 12 PO) Take 1 tablet by mouth daily.   EPINEPHrine 0.3 mg/0.3 mL IJ SOAJ injection Inject 0.3 mg into the muscle once as needed.   Ferrous Sulfate (IRON) 325 (65 Fe) MG TABS Take 1 tablet by mouth daily.   fluticasone (FLONASE) 50 MCG/ACT nasal spray 1 spray by Each Nare route.   glucose blood (ONETOUCH VERIO) test strip 1 strip.   metFORMIN (GLUCOPHAGE) 500 MG tablet Take 250 mg by mouth 2 (two) times daily with a meal.   montelukast (SINGULAIR) 10 MG tablet Take 10 mg by mouth at bedtime.    Multiple Vitamin (MULTIVITAMIN) tablet Take 1 tablet by mouth daily.   olmesartan (BENICAR) 40 MG tablet Take 40 mg by mouth every evening.   ONETOUCH VERIO test strip 1 each daily.   pantoprazole (PROTONIX) 40 MG tablet daily.   triamcinolone cream (KENALOG) 0.1 % Apply 1 application topically as  needed.   VITAMIN D PO Take 1,000 Units by mouth daily.   [DISCONTINUED] atorvastatin (LIPITOR) 10 MG tablet Take 2042muesday and Thursday. All other days of the week take 38m65m Radiology    CXR 04/12/2021:  The heart, hila, and mediastinum are normal. There is a nodule in the left apex, unchanged since 2016, of no significance. No new nodules or masses. No focal infiltrates. No other acute abnormalities.  Cardiac Studies:   Nuclear stress test  [12/301/29/20161. The resting electrocardiogram demonstrated normal sinus rhythm, normal resting conduction, no resting arrhythmias and normal rest repolarization. The stress electrocardiogram was at most equivocal for ischemia. There was 1 mm upsloping ST depression with exercise, back to baseline into recovery. Stress test terminated due to fatigue. Normal blood pressure response. The patient completed an estimated workload of 7.4 METS. 2. Myocardial perfusion imaging is normal. Overall left ventricular systolic function was normal without regional wall motion abnormalities. The left ventricular ejection fraction was 52%.  Cardiac cath 01/23/2017: Proximal LAD 30%, ostial ramus 40%, mid RCA mild disease. Normal LVEF.   EKG   EKG 11/09/2021: Normal sinus rhythm with a rate of 73 bpm, normal axis.  No evidence of ischemia, normal EKG. No significant change from EKG 02/09/2020   Assessment     ICD-10-CM   1. Essential hypertension  I10 EKG 12-Lead    2. Coronary artery disease of native artery of native heart with stable angina pectoris (HCC)[place]25.118 EKG 12-Lead    3.  Controlled type 2 diabetes mellitus with complication, without long-term current use of insulin (HCC)  E11.8     4. Hypercholesteremia  E78.00      No orders of the defined types were placed in this encounter.  Medications Discontinued During This Encounter  Medication Reason   nirmatrelvir/ritonavir EUA (PAXLOVID) TABS    atorvastatin (LIPITOR) 10 MG tablet Change in  therapy   Recommendations:   OLAND ARQUETTE  is a 71 y.o. Asian [place] male with moderate coronary artery disease by angiogram on 01/23/2017, exertional chest pain, GERD.  Is a 65-monthoffice visit.  He remains asymptomatic but would like to see me on a 6 monthly basis.  I reviewed his labs, lipids under excellent control, renal function is normal.  His A1c has slightly increased to 7.0, advised him not to be too concerned as he is very careful with his diet and exercise.  In fact advised him that he should increase his protein intake in view of his age.  Blood pressure initially was elevated when he walked into the office but completely normal and well controlled with the present medical regimen.  He is on triple drug therapy.  Continue the same.  No changes in the medications were done by me today, EKG reveals normal sinus rhythm and no change in physical exam.  I will see him back in 6 months for follow-up.   JAdrian Prows MD, F[facility]1/18/2023, 11:31 AM Office: [phone]

## 2021-11-25 DIAGNOSIS — L309 Dermatitis, unspecified: Secondary | ICD-10-CM | POA: Diagnosis not present

## 2021-11-25 DIAGNOSIS — J309 Allergic rhinitis, unspecified: Secondary | ICD-10-CM | POA: Diagnosis not present

## 2022-01-19 DIAGNOSIS — E785 Hyperlipidemia, unspecified: Secondary | ICD-10-CM | POA: Diagnosis not present

## 2022-01-19 DIAGNOSIS — R1033 Periumbilical pain: Secondary | ICD-10-CM | POA: Diagnosis not present

## 2022-01-19 DIAGNOSIS — R1011 Right upper quadrant pain: Secondary | ICD-10-CM | POA: Diagnosis not present

## 2022-01-19 DIAGNOSIS — R140 Abdominal distension (gaseous): Secondary | ICD-10-CM | POA: Diagnosis not present

## 2022-01-19 DIAGNOSIS — K5730 Diverticulosis of large intestine without perforation or abscess without bleeding: Secondary | ICD-10-CM | POA: Diagnosis not present

## 2022-01-19 DIAGNOSIS — K219 Gastro-esophageal reflux disease without esophagitis: Secondary | ICD-10-CM | POA: Diagnosis not present

## 2022-01-19 DIAGNOSIS — I10 Essential (primary) hypertension: Secondary | ICD-10-CM | POA: Diagnosis not present

## 2022-01-23 ENCOUNTER — Other Ambulatory Visit: Payer: Self-pay | Admitting: Gastroenterology

## 2022-01-23 DIAGNOSIS — R1033 Periumbilical pain: Secondary | ICD-10-CM

## 2022-01-23 DIAGNOSIS — R1011 Right upper quadrant pain: Secondary | ICD-10-CM

## 2022-02-03 ENCOUNTER — Ambulatory Visit
Admission: RE | Admit: 2022-02-03 | Discharge: 2022-02-03 | Disposition: A | Discharge status: Home / Self Care | Payer: Medicare Other | Source: Ambulatory Visit | Attending: Gastroenterology | Admitting: Gastroenterology

## 2022-02-03 DIAGNOSIS — N281 Cyst of kidney, acquired: Secondary | ICD-10-CM | POA: Diagnosis not present

## 2022-02-03 DIAGNOSIS — R1011 Right upper quadrant pain: Secondary | ICD-10-CM

## 2022-02-03 DIAGNOSIS — R1033 Periumbilical pain: Secondary | ICD-10-CM

## 2022-02-03 DIAGNOSIS — K5730 Diverticulosis of large intestine without perforation or abscess without bleeding: Secondary | ICD-10-CM | POA: Diagnosis not present

## 2022-02-03 DIAGNOSIS — N3289 Other specified disorders of bladder: Secondary | ICD-10-CM | POA: Diagnosis not present

## 2022-02-03 MED ORDER — IOPAMIDOL (ISOVUE-300) INJECTION 61%
100.0000 mL | Freq: Once | INTRAVENOUS | Status: AC | PRN
  Administered 2022-02-03: 100 mL via INTRAVENOUS

## 2022-02-06 DIAGNOSIS — E785 Hyperlipidemia, unspecified: Secondary | ICD-10-CM | POA: Diagnosis not present

## 2022-02-06 DIAGNOSIS — I10 Essential (primary) hypertension: Secondary | ICD-10-CM | POA: Diagnosis not present

## 2022-02-06 DIAGNOSIS — J309 Allergic rhinitis, unspecified: Secondary | ICD-10-CM | POA: Diagnosis not present

## 2022-02-06 DIAGNOSIS — R109 Unspecified abdominal pain: Secondary | ICD-10-CM | POA: Diagnosis not present

## 2022-02-06 DIAGNOSIS — E1169 Type 2 diabetes mellitus with other specified complication: Secondary | ICD-10-CM | POA: Diagnosis not present

## 2022-02-09 ENCOUNTER — Other Ambulatory Visit (HOSPITAL_COMMUNITY): Payer: Self-pay | Admitting: Gastroenterology

## 2022-02-09 ENCOUNTER — Other Ambulatory Visit: Payer: Self-pay | Admitting: Gastroenterology

## 2022-02-09 DIAGNOSIS — Z86010 Personal history of colonic polyps: Secondary | ICD-10-CM | POA: Diagnosis not present

## 2022-02-09 DIAGNOSIS — R1011 Right upper quadrant pain: Secondary | ICD-10-CM

## 2022-02-09 DIAGNOSIS — K219 Gastro-esophageal reflux disease without esophagitis: Secondary | ICD-10-CM | POA: Diagnosis not present

## 2022-02-09 DIAGNOSIS — K5730 Diverticulosis of large intestine without perforation or abscess without bleeding: Secondary | ICD-10-CM | POA: Diagnosis not present

## 2022-02-22 ENCOUNTER — Encounter: Payer: Self-pay | Admitting: Cardiology

## 2022-02-23 NOTE — Telephone Encounter (Signed)
From pt

## 2022-03-06 ENCOUNTER — Encounter: Payer: Self-pay | Admitting: Cardiology

## 2022-03-06 ENCOUNTER — Encounter (HOSPITAL_COMMUNITY)
Admission: RE | Admit: 2022-03-06 | Discharge: 2022-03-06 | Disposition: A | Discharge status: Home / Self Care | Payer: Medicare Other | Source: Ambulatory Visit | Attending: Gastroenterology | Admitting: Gastroenterology

## 2022-03-06 ENCOUNTER — Ambulatory Visit (HOSPITAL_COMMUNITY)
Admission: RE | Admit: 2022-03-06 | Discharge: 2022-03-06 | Disposition: A | Discharge status: Home / Self Care | Payer: Medicare Other | Source: Ambulatory Visit | Attending: Gastroenterology | Admitting: Gastroenterology

## 2022-03-06 DIAGNOSIS — R1011 Right upper quadrant pain: Secondary | ICD-10-CM

## 2022-03-06 MED ORDER — TECHNETIUM TC 99M MEBROFENIN IV KIT
5.1200 | PACK | Freq: Once | INTRAVENOUS | Status: AC | PRN
  Administered 2022-03-06: 5.12 via INTRAVENOUS

## 2022-03-13 DIAGNOSIS — R9341 Abnormal radiologic findings on diagnostic imaging of renal pelvis, ureter, or bladder: Secondary | ICD-10-CM | POA: Diagnosis not present

## 2022-05-10 ENCOUNTER — Other Ambulatory Visit (HOSPITAL_COMMUNITY): Payer: Self-pay

## 2022-05-10 ENCOUNTER — Ambulatory Visit: Payer: Medicare Other | Admitting: Cardiology

## 2022-05-10 ENCOUNTER — Encounter: Payer: Self-pay | Admitting: Cardiology

## 2022-05-10 VITALS — BP 122/84 | HR 80 | Temp 97.8°F | Resp 16 | Ht 68.0 in | Wt 129.8 lb

## 2022-05-10 DIAGNOSIS — I10 Essential (primary) hypertension: Secondary | ICD-10-CM | POA: Diagnosis not present

## 2022-05-10 DIAGNOSIS — E118 Type 2 diabetes mellitus with unspecified complications: Secondary | ICD-10-CM

## 2022-05-10 DIAGNOSIS — I2510 Atherosclerotic heart disease of native coronary artery without angina pectoris: Secondary | ICD-10-CM

## 2022-05-10 DIAGNOSIS — E7800 Pure hypercholesterolemia, unspecified: Secondary | ICD-10-CM | POA: Diagnosis not present

## 2022-05-10 MED ORDER — NITROGLYCERIN 0.4 MG SL SUBL
0.4000 mg | SUBLINGUAL_TABLET | SUBLINGUAL | 3 refills | Status: DC | PRN
Start: 2022-05-10 — End: 2022-05-16
  Filled 2022-05-10: qty 25, 25d supply, fill #0

## 2022-05-10 NOTE — Progress Notes (Signed)
 Primary Physician/Referring:  Wolters, Sharon, MD  Patient ID: Aaron David, male    DOB: 12/13/1950, 71 y.o.   MRN: [id number]  Chief Complaint  Patient presents with   Coronary Artery Disease   Hypertension   Hyperlipidemia   Follow-up    6 months   HPI:    Aaron David  is a 71 y.o. Asian [place] male with moderate coronary artery disease by angiogram on 01/23/2017, exertional chest pain, GERD.  Medical history significant for hypertension and hyperlipidemia controlled diabetes mellitus.  This is a 6-month office visit.  He remains asymptomatic but would like to see me on a 6 monthly basis.  Past Medical History:  Diagnosis Date   Acid reflux    Borderline diabetes    Coronary artery disease    Headache    Hyperlipidemia    Hypertension    Past Surgical History:  Procedure Laterality Date   LEFT HEART CATH AND CORONARY ANGIOGRAPHY N/A 01/23/2017   Procedure: Left Heart Cath and Coronary Angiography;  Surgeon: Jay Ganji, MD;  Location: [facility];  Service: Cardiovascular;  Laterality: N/A;   Family History  Problem Relation Age of Onset   Diabetes Mother    Heart disease Mother    Asthma Father    Hypertension Sister    Heart disease Brother    Diabetes Sister    Diabetes Brother    Diabetes Brother    Heart disease Brother    Diabetes Brother    Diabetes Brother    Social History   Tobacco Use   Smoking status: Never   Smokeless tobacco: Never  Substance Use Topics   Alcohol use: No    Alcohol/week: 0.0 standard drinks of alcohol   Marital Status: Married  ROS  Review of Systems  Cardiovascular:  Negative for chest pain, dyspnea on exertion and leg swelling.  Gastrointestinal:  Negative for melena.   Objective      05/10/2022    2:11 PM 11/09/2021   10:44 AM 05/10/2021   11:10 AM  Vitals with BMI  Height 5' 8" 5' 8"   Weight 129 lbs 13 oz 129 lbs 3 oz   BMI 19.74 19.65   Systolic 122 126 137  Diastolic 84 76 86  Pulse  80 78 82   Physical Exam Cardiovascular:     Rate and Rhythm: Normal rate and regular rhythm.     Pulses: Intact distal pulses.     Heart sounds: Normal heart sounds. No murmur heard.    No gallop.     Comments: No leg edema, no JVD. Pulmonary:     Effort: Pulmonary effort is normal.     Breath sounds: Normal breath sounds.  Abdominal:     General: Bowel sounds are normal.     Palpations: Abdomen is soft.    Laboratory examination:   External Labs:   Labs 02/06/2022:  TSH normal at 1.80.  A1c 7.0%.  Hb 13.8/HCT 40.4, platelets 138.  Normal indicis.  BUN 9, creatinine 0.80, EGFR 95 mL, LFTs normal.  02/15/2021: Total cholesterol 135, triglycerides 115, HDL 45, LDL 68  Allergies   Allergies  Allergen Reactions   Penicillins Rash    itching    Medications   Current Outpatient Medications:    albuterol (PROVENTIL HFA;VENTOLIN HFA) 108 (90 BASE) MCG/ACT inhaler, Inhale 2 puffs into the lungs every 6 (six) hours as needed for wheezing or shortness of breath., Disp: , Rfl:    amLODipine (NORVASC) 5   MG tablet, Take 1 tablet (5 mg total) by mouth daily. (Patient taking differently: Take 5 mg by mouth daily as needed (SBP >140 mm Hg).), Disp: 100 tablet, Rfl: 3   aspirin 81 MG tablet, Take 81 mg by mouth daily., Disp: , Rfl:    atorvastatin (LIPITOR) 40 MG tablet, Take 1 tablet by mouth daily., Disp: , Rfl:    Azelastine-Fluticasone 137-50 MCG/ACT SUSP, Place 1 spray into the nose as needed., Disp: , Rfl:    carvedilol (COREG) 12.5 MG tablet, Take 1 tablet (12.5 mg total) by mouth 2 (two) times daily., Disp: 200 tablet, Rfl: 3   Cyanocobalamin (VITAMIN B 12 PO), Take 1 tablet by mouth daily., Disp: , Rfl:    EPINEPHrine 0.3 mg/0.3 mL IJ SOAJ injection, Inject 0.3 mg into the muscle once as needed., Disp: , Rfl: 1   famotidine (PEPCID) 40 MG tablet, Take 40 mg by mouth 2 (two) times daily., Disp: , Rfl:    Ferrous Sulfate (IRON) 325 (65 Fe) MG TABS, Take 1 tablet by mouth  daily., Disp: , Rfl:    fluticasone (FLONASE) 50 MCG/ACT nasal spray, 1 spray by Each Nare route., Disp: , Rfl:    glucose blood (ONETOUCH VERIO) test strip, 1 strip., Disp: , Rfl:    metFORMIN (GLUCOPHAGE) 500 MG tablet, Take 250 mg by mouth 2 (two) times daily with a meal., Disp: , Rfl:    montelukast (SINGULAIR) 10 MG tablet, Take 10 mg by mouth at bedtime. , Disp: , Rfl:    Multiple Vitamin (MULTIVITAMIN) tablet, Take 1 tablet by mouth daily., Disp: , Rfl:    nitroGLYCERIN (NITROSTAT) 0.4 MG SL tablet, Place 1 tablet (0.4 mg total) under the tongue every 5 (five) minutes as needed for chest pain., Disp: 25 tablet, Rfl: 3   olmesartan (BENICAR) 40 MG tablet, Take 40 mg by mouth every evening., Disp: 30 tablet, Rfl: 2   ONETOUCH VERIO test strip, 1 each daily., Disp: , Rfl:    triamcinolone cream (KENALOG) 0.1 %, Apply 1 application topically as needed., Disp: , Rfl:    VITAMIN D PO, Take 1,000 Units by mouth daily., Disp: , Rfl:   Radiology    CXR 04/12/2021:  The heart, hila, and mediastinum are normal. There is a nodule in the left apex, unchanged since 2016, of no significance. No new nodules or masses. No focal infiltrates. No other acute abnormalities.  Cardiac Studies:   Nuclear stress test  [24-Oct-2014]:  1. The resting electrocardiogram demonstrated normal sinus rhythm, normal resting conduction, no resting arrhythmias and normal rest repolarization. The stress electrocardiogram was at most equivocal for ischemia. There was 1 mm upsloping ST depression with exercise, back to baseline into recovery. Stress test terminated due to fatigue. Normal blood pressure response. The patient completed an estimated workload of 7.4 METS. 2. Myocardial perfusion imaging is normal. Overall left ventricular systolic function was normal without regional wall motion abnormalities. The left ventricular ejection fraction was 52%.  Cardiac cath 01/23/2017: Proximal LAD 30%, ostial ramus 40%, mid RCA  mild disease. Normal LVEF.  EKG   EKG 05/10/2022: Normal sinus rhythm at rate of 77 bpm, normal EKG. No significant change from EKG 11/09/2021   Assessment     ICD-10-CM   1. Coronary artery disease involving native coronary artery of native heart without angina pectoris  I25.10 EKG 12-Lead    nitroGLYCERIN (NITROSTAT) 0.4 MG SL tablet    2. Essential hypertension  I10     3. Hypercholesteremia  E78.00  4. Controlled type 2 diabetes mellitus with complication, without long-term current use of insulin (HCC)  E11.8       Meds ordered this encounter  Medications   nitroGLYCERIN (NITROSTAT) 0.4 MG SL tablet    Sig: Place 1 tablet (0.4 mg total) under the tongue every 5 (five) minutes as needed for chest pain.    Dispense:  25 tablet    Refill:  3   Medications Discontinued During This Encounter  Medication Reason   pantoprazole (PROTONIX) 40 MG tablet Change in therapy    Recommendations:   Aaron David  is a 71 y.o. Asian [place] male with moderate coronary artery disease by angiogram on 01/23/2017, exertional chest pain, GERD.  This is a 65-monthoffice visit.  He remains asymptomatic but would like to see me on a 6 monthly basis.  I reviewed his labs, lipids under excellent control, renal function is normal.  DM is under good control. External labs reviewed.   He wishes to have NTG for PRN use, I suspect some of the episodes of chest pain is related to GERD and not necessarily angina.   No changes in the medications were done today. I will see him in 6 months.    JAdrian Prows MD, FGl[facility]7/19/2023, 9:03 PM Office: [phone]

## 2022-05-16 ENCOUNTER — Other Ambulatory Visit: Payer: Self-pay

## 2022-05-16 ENCOUNTER — Telehealth: Payer: Self-pay

## 2022-05-16 DIAGNOSIS — I2510 Atherosclerotic heart disease of native coronary artery without angina pectoris: Secondary | ICD-10-CM

## 2022-05-16 MED ORDER — NITROGLYCERIN 0.4 MG SL SUBL: 0.4000 mg | SUBLINGUAL_TABLET | SUBLINGUAL | 3 refills | Status: DC | PRN

## 2022-05-16 NOTE — Telephone Encounter (Signed)
error 

## 2022-05-17 ENCOUNTER — Other Ambulatory Visit (HOSPITAL_COMMUNITY): Payer: Self-pay

## 2022-05-24 DIAGNOSIS — E1169 Type 2 diabetes mellitus with other specified complication: Secondary | ICD-10-CM | POA: Diagnosis not present

## 2022-05-24 DIAGNOSIS — E785 Hyperlipidemia, unspecified: Secondary | ICD-10-CM | POA: Diagnosis not present

## 2022-05-24 DIAGNOSIS — R5381 Other malaise: Secondary | ICD-10-CM | POA: Diagnosis not present

## 2022-05-24 DIAGNOSIS — J019 Acute sinusitis, unspecified: Secondary | ICD-10-CM | POA: Diagnosis not present

## 2022-07-10 DIAGNOSIS — Z23 Encounter for immunization: Secondary | ICD-10-CM | POA: Diagnosis not present

## 2022-08-02 DIAGNOSIS — E1169 Type 2 diabetes mellitus with other specified complication: Secondary | ICD-10-CM | POA: Diagnosis not present

## 2022-08-02 DIAGNOSIS — E782 Mixed hyperlipidemia: Secondary | ICD-10-CM | POA: Diagnosis not present

## 2022-08-02 DIAGNOSIS — I2510 Atherosclerotic heart disease of native coronary artery without angina pectoris: Secondary | ICD-10-CM | POA: Diagnosis not present

## 2022-08-02 DIAGNOSIS — Z79899 Other long term (current) drug therapy: Secondary | ICD-10-CM | POA: Diagnosis not present

## 2022-08-02 DIAGNOSIS — D692 Other nonthrombocytopenic purpura: Secondary | ICD-10-CM | POA: Diagnosis not present

## 2022-08-02 DIAGNOSIS — Z0000 Encounter for general adult medical examination without abnormal findings: Secondary | ICD-10-CM | POA: Diagnosis not present

## 2022-08-02 DIAGNOSIS — I10 Essential (primary) hypertension: Secondary | ICD-10-CM | POA: Diagnosis not present

## 2022-08-02 DIAGNOSIS — E559 Vitamin D deficiency, unspecified: Secondary | ICD-10-CM | POA: Diagnosis not present

## 2022-08-02 DIAGNOSIS — I6789 Other cerebrovascular disease: Secondary | ICD-10-CM | POA: Diagnosis not present

## 2022-08-02 DIAGNOSIS — Z1159 Encounter for screening for other viral diseases: Secondary | ICD-10-CM | POA: Diagnosis not present

## 2022-08-02 DIAGNOSIS — R634 Abnormal weight loss: Secondary | ICD-10-CM | POA: Diagnosis not present

## 2022-08-15 DIAGNOSIS — E119 Type 2 diabetes mellitus without complications: Secondary | ICD-10-CM | POA: Diagnosis not present

## 2022-10-08 IMAGING — DX DG RIBS W/ CHEST 3+V*L*
3 series · 3 of 3 positions shown · non-contrast
Comparison: Chest x-ray 04/12/2021, 01/01/2009.

CLINICAL DATA: Recent fall.  Pain left ribs.

EXAM:
LEFT RIBS AND CHEST - 3+ VIEW

[dg ribs unilateral w/chest left (1 of 3)]
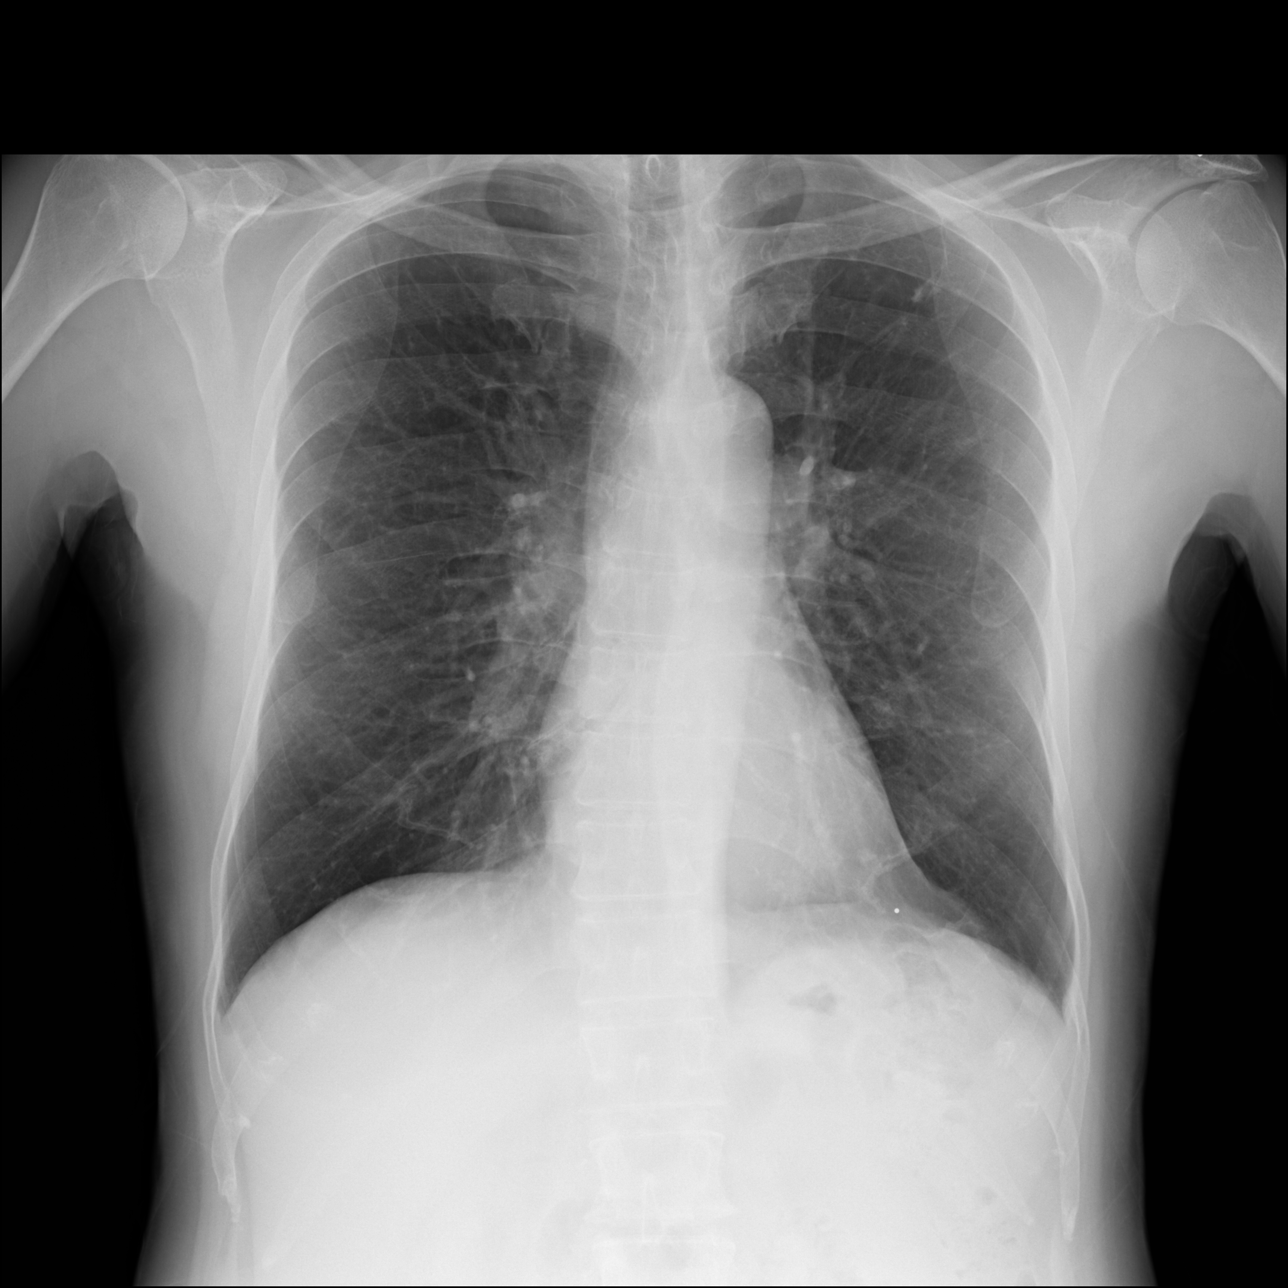

[dg ribs unilateral w/chest left (2 of 3)]
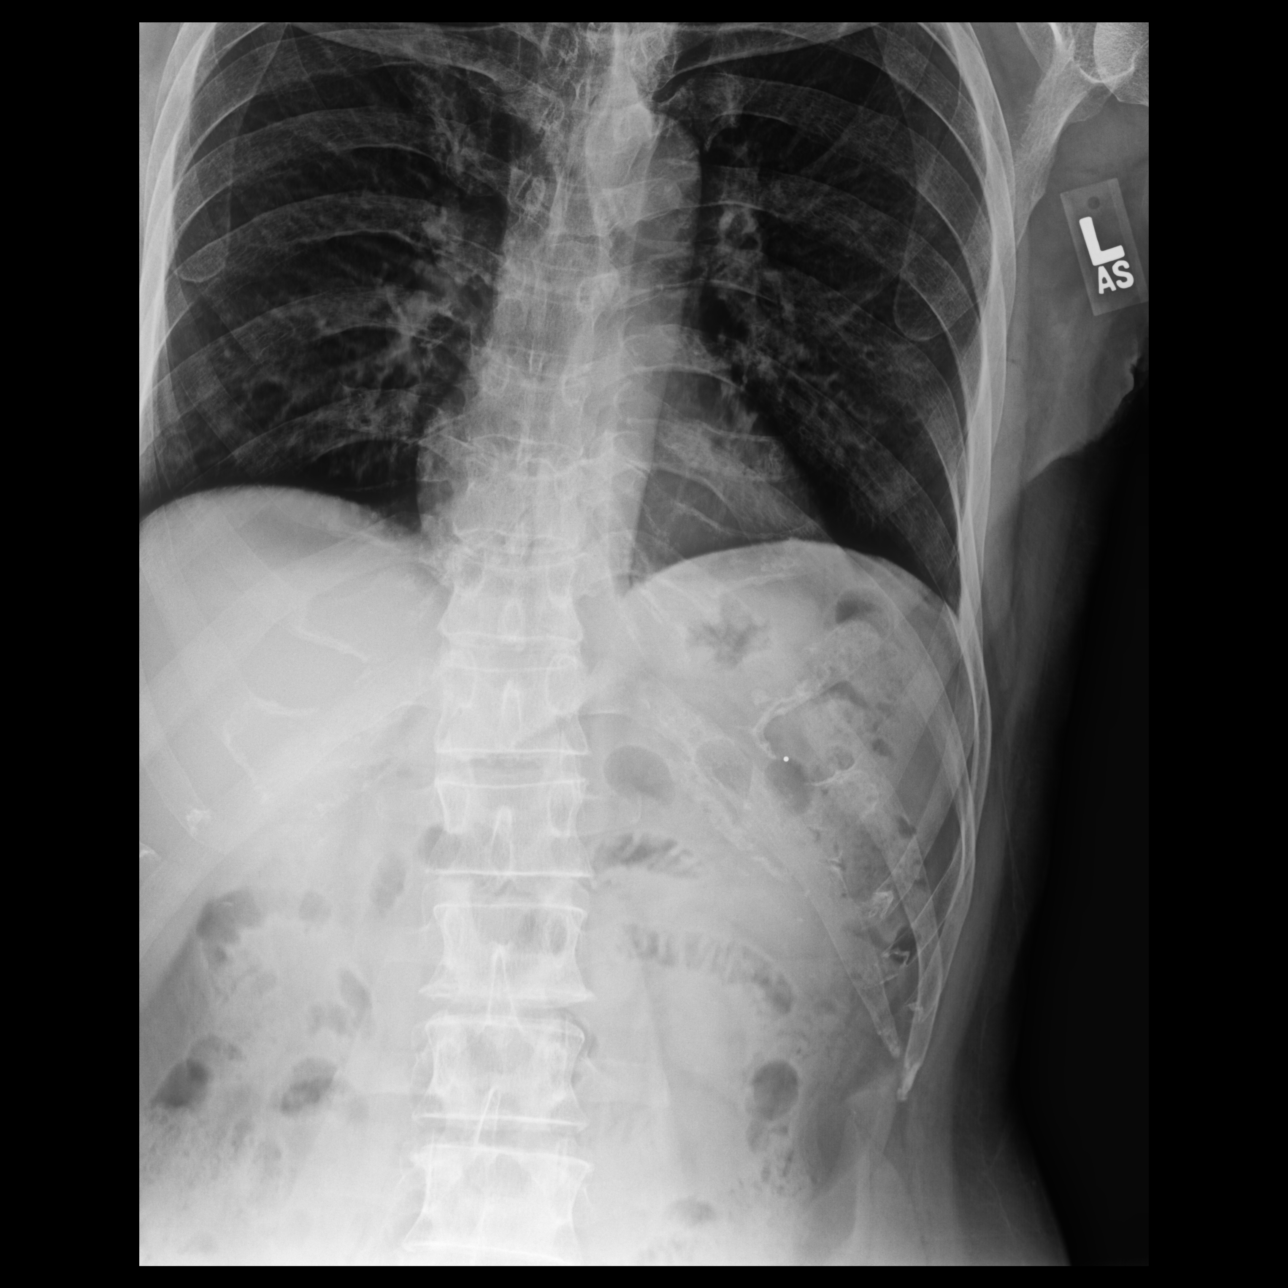

[dg ribs unilateral w/chest left (3 of 3)]
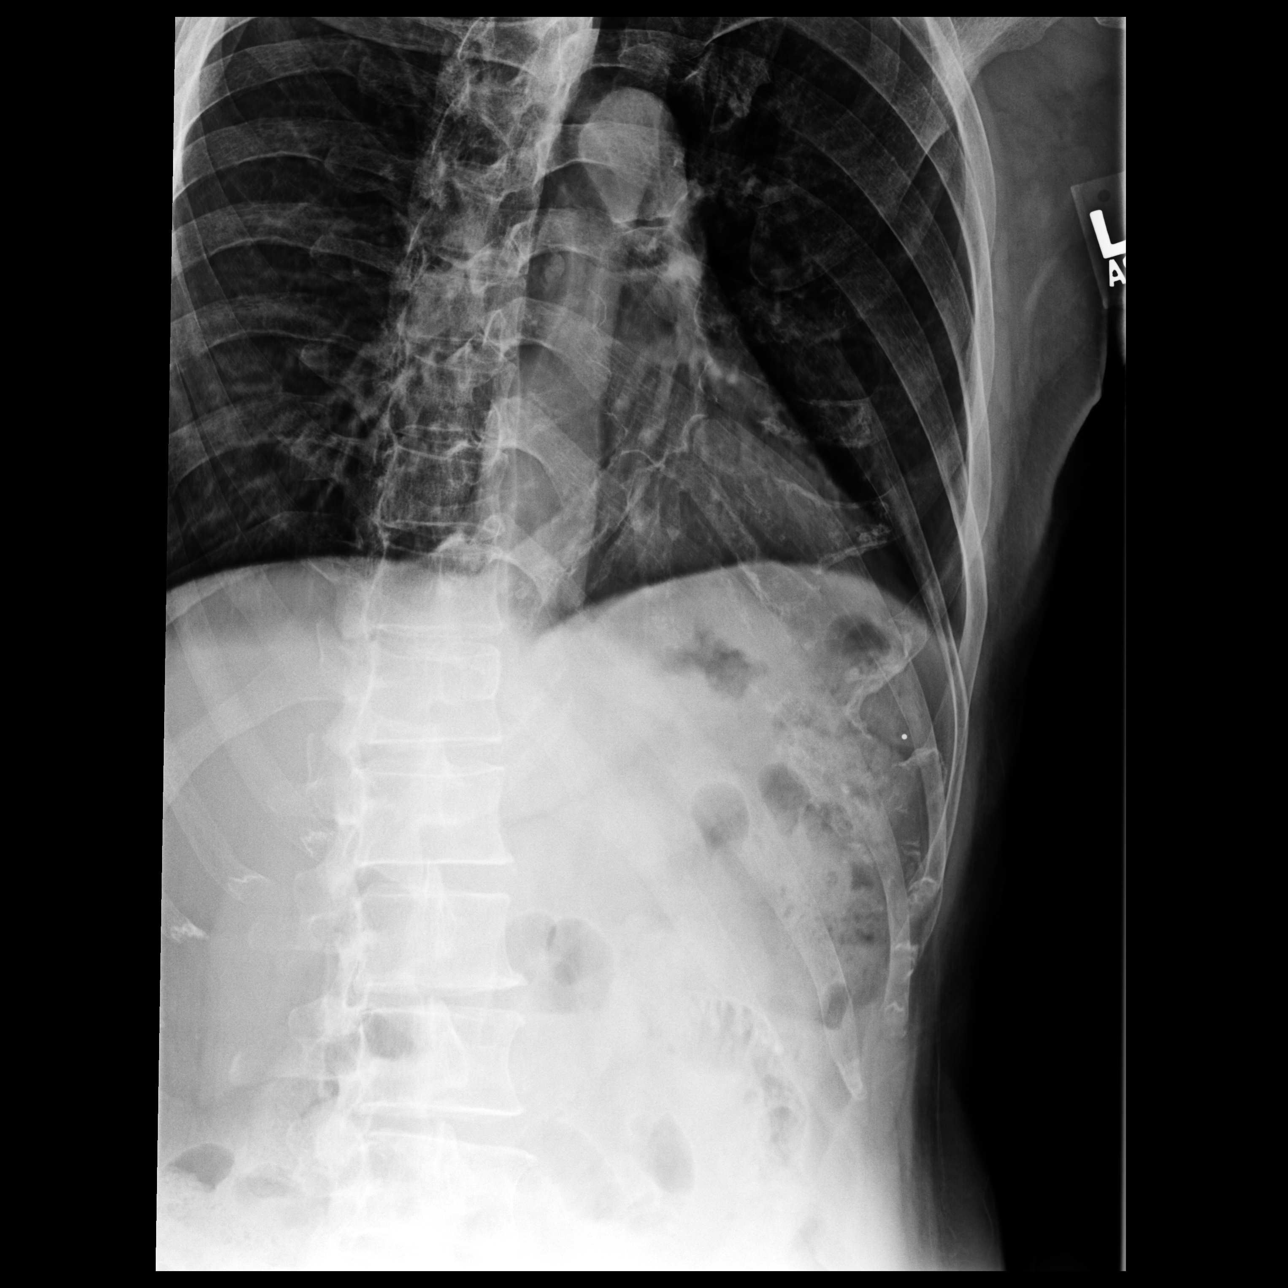

[3 of 3 positions shown; findings below may reference images not displayed]

FINDINGS: Heart size normal. No acute pulmonary infiltrate. Stable left apical
pulmonary nodule. No pleural effusion or pneumothorax. Mild thoracic
spine scoliosis. Limited exam, upper ribs not completely imaged. No
evidence of displaced rib fracture identified.
IMPRESSION: No evidence of displaced rib fracture.  No pneumothorax.

## 2022-11-08 ENCOUNTER — Encounter: Payer: Self-pay | Admitting: Cardiology

## 2022-11-08 ENCOUNTER — Ambulatory Visit: Payer: Medicare Other | Admitting: Cardiology

## 2022-11-08 VITALS — BP 128/76 | HR 78 | Ht 68.0 in | Wt 130.0 lb

## 2022-11-08 DIAGNOSIS — E119 Type 2 diabetes mellitus without complications: Secondary | ICD-10-CM

## 2022-11-08 DIAGNOSIS — I10 Essential (primary) hypertension: Secondary | ICD-10-CM

## 2022-11-08 DIAGNOSIS — I2510 Atherosclerotic heart disease of native coronary artery without angina pectoris: Secondary | ICD-10-CM

## 2022-11-08 DIAGNOSIS — E7800 Pure hypercholesterolemia, unspecified: Secondary | ICD-10-CM

## 2022-11-08 MED ORDER — OLMESARTAN MEDOXOMIL 20 MG PO TABS: 10.0000 mg | ORAL_TABLET | Freq: Every day | ORAL | 3 refills | Status: DC

## 2022-11-08 NOTE — Progress Notes (Signed)
Virtual Visit via Video Note: This visit type was conducted due to patient and patient has relocated to [facility].   This format is felt to be most appropriate for this patient at this time.  All issues noted in this document were discussed and addressed.  No physical exam was performed (except for noted visual exam findings with Telehealth visits).  The patient has consented to conduct a Telehealth visit and understands insurance will be billed.   I connected with Aaron David  on 11/08/22 at  by a video enabled telemedicine application and verified that I am speaking with the correct person using two identifiers.   I discussed the limitations of evaluation and management by telemedicine and the availability of in person appointments. The patient expressed understanding and agreed to proceed.  Primary Physician/Referring:  [name], MD  Patient ID: Aaron David, male    DOB: [birth year], 72 y.o.   MRN: [id number]  Chief Complaint  Patient presents with   Coronary atherosclerosis   Hypertension   Follow-up    6 months   HPI:    Aaron David  is a 72 y.o. [place] male with moderate coronary artery disease by angiogram on 01/23/2017,  stable angina pectoris, GERD, hypertension, hyperlipidemia controlled diabetes mellitus.  This is a 71-monthoffice visit.  He remains asymptomatic but would like to see me on a 6 monthly basis.  No new symptoms, no new complaints.  Past Medical History:  Diagnosis Date   Acid reflux    Borderline diabetes    Coronary artery disease    Headache    Hyperlipidemia    Hypertension    Past Surgical History:  Procedure Laterality Date   LEFT HEART CATH AND CORONARY ANGIOGRAPHY N/A 01/23/2017   Procedure: Left Heart Cath and Coronary Angiography;  Surgeon: JAdrian Prows MD;  [facility];  Service: Cardiovascular;  Laterality: N/A;   Family History  Problem Relation Age of Onset   Diabetes Mother    Heart disease  Mother    Asthma Father    Hypertension Sister    Heart disease Brother    Diabetes Sister    Diabetes Brother    Diabetes Brother    Heart disease Brother    Diabetes Brother    Diabetes Brother    Social History   Tobacco Use   Smoking status: Never   Smokeless tobacco: Never  Substance Use Topics   Alcohol use: No    Alcohol/week: 0.0 standard drinks of alcohol   Marital Status: Married  ROS  Review of Systems  Cardiovascular:  Negative for chest pain, dyspnea on exertion and leg swelling.  Gastrointestinal:  Negative for melena.   Objective      11/08/2022    4:10 PM 05/10/2022    2:11 PM 11/09/2021   10:44 AM  Vitals with BMI  Height '5\' 8"'$  '5\' 8"'$  '5\' 8"'$   Weight 130 lbs 129 lbs 13 oz 129 lbs 3 oz  BMI 19.77 [id number] Systolic [id number] Diastolic 76 84 76  Pulse 78 80 78   Physical exam not performed or limited due to virtual visit.  Patient appeared to be in no distress, Neck was supple, respiration was not labored.  Please see exam details from prior visit is as below.  Physical Exam Neck:     Vascular: No carotid bruit or JVD.  Cardiovascular:     Rate and Rhythm: Normal rate and regular rhythm.  Pulses: Intact distal pulses.     Heart sounds: Normal heart sounds. No murmur heard.    No gallop.  Pulmonary:     Effort: Pulmonary effort is normal.     Breath sounds: Normal breath sounds.  Abdominal:     General: Bowel sounds are normal.     Palpations: Abdomen is soft.  Musculoskeletal:     Right lower leg: No edema.     Left lower leg: No edema.    Laboratory examination:   External Labs:   Labs Labs 05/28/2022:  Total cholesterol 132, triglycerides 148, HDL 40, LDL 66.  A1c 6.7%.  TSH normal at 1.79.  Urine analysis normal.  Labs 02/06/2022:  TSH normal at 1.80.  A1c 7.0%.  Hb 13.8/HCT 40.4, platelets 138.  Normal indicis.  BUN 9, creatinine 0.80, EGFR 95 mL, LFTs normal.  02/15/2021: Total cholesterol 135, triglycerides 115, HDL  45, LDL 68  Allergies   Allergies  Allergen Reactions   Penicillins Rash    itching    Medications   Current Outpatient Medications:    albuterol (PROVENTIL HFA;VENTOLIN HFA) 108 (90 BASE) MCG/ACT inhaler, Inhale 2 puffs into the lungs every 6 (six) hours as needed for wheezing or shortness of breath., Disp: , Rfl:    aspirin 81 MG tablet, Take 81 mg by mouth daily., Disp: , Rfl:    atorvastatin (LIPITOR) 40 MG tablet, Take 1 tablet by mouth daily., Disp: , Rfl:    Azelastine-Fluticasone 137-50 MCG/ACT SUSP, Place 1 spray into the nose as needed., Disp: , Rfl:    carvedilol (COREG) 12.5 MG tablet, Take 1 tablet (12.5 mg total) by mouth 2 (two) times daily., Disp: 200 tablet, Rfl: 3   Cyanocobalamin (VITAMIN B 12 PO), Take 1 tablet by mouth daily., Disp: , Rfl:    EPINEPHrine 0.3 mg/0.3 mL IJ SOAJ injection, Inject 0.3 mg into the muscle once as needed., Disp: , Rfl: 1   famotidine (PEPCID) 40 MG tablet, Take 40 mg by mouth 2 (two) times daily., Disp: , Rfl:    Ferrous Sulfate (IRON) 325 (65 Fe) MG TABS, Take 1 tablet by mouth daily., Disp: , Rfl:    fluticasone (FLONASE) 50 MCG/ACT nasal spray, 1 spray by Each Nare route., Disp: , Rfl:    glucose blood (ONETOUCH VERIO) test strip, 1 strip., Disp: , Rfl:    metFORMIN (GLUCOPHAGE) 500 MG tablet, Take 250 mg by mouth 2 (two) times daily with a meal., Disp: , Rfl:    montelukast (SINGULAIR) 10 MG tablet, Take 10 mg by mouth at bedtime. , Disp: , Rfl:    Multiple Vitamin (MULTIVITAMIN) tablet, Take 1 tablet by mouth daily., Disp: , Rfl:    olmesartan (BENICAR) 20 MG tablet, Take 0.5 tablets (10 mg total) by mouth daily., Disp: 45 tablet, Rfl: 3   ONETOUCH VERIO test strip, 1 each daily., Disp: , Rfl:    triamcinolone cream (KENALOG) 0.1 %, Apply 1 application topically as needed., Disp: , Rfl:    VITAMIN D PO, Take 1,000 Units by mouth daily., Disp: , Rfl:    amLODipine (NORVASC) 5 MG tablet, Take 1 tablet (5 mg total) by mouth daily.  (Patient not taking: Reported on 11/08/2022), Disp: 100 tablet, Rfl: 3   nitroGLYCERIN (NITROSTAT) 0.4 MG SL tablet, Place 1 tablet (0.4 mg total) under the tongue every 5 (five) minutes as needed for chest pain., Disp: 25 tablet, Rfl: 3  Radiology    CXR 04/12/2021:  The heart, hila, and mediastinum are  normal. There is a nodule in the left apex, unchanged since 2016, of no significance. No new nodules or masses. No focal infiltrates. No other acute abnormalities.  Cardiac Studies:   Nuclear stress test  [11/09/14]:  1. The resting electrocardiogram demonstrated normal sinus rhythm, normal resting conduction, no resting arrhythmias and normal rest repolarization. The stress electrocardiogram was at most equivocal for ischemia. There was 1 mm upsloping ST depression with exercise, back to baseline into recovery. Stress test terminated due to fatigue. Normal blood pressure response. The patient completed an estimated workload of 7.4 METS. 2. Myocardial perfusion imaging is normal. Overall left ventricular systolic function was normal without regional wall motion abnormalities. The left ventricular ejection fraction was 52%.  Cardiac cath 01/23/2017: Proximal LAD 30%, ostial ramus 40%, mid RCA mild disease. Normal LVEF.  EKG   EKG 05/10/2022: Normal sinus rhythm at rate of 77 bpm, normal EKG. No significant change from EKG 11/09/2021   Assessment     ICD-10-CM   1. Coronary artery disease involving native coronary artery of native heart without angina pectoris  I25.10     2. Essential hypertension  I10 olmesartan (BENICAR) 20 MG tablet    3. Hypercholesteremia  E78.00     4. Type 2 diabetes mellitus without complication, without long-term current use of insulin (HCC)  E11.9 olmesartan (BENICAR) 20 MG tablet      Meds ordered this encounter  Medications   olmesartan (BENICAR) 20 MG tablet    Sig: Take 0.5 tablets (10 mg total) by mouth daily.    Dispense:  45 tablet    Refill:  3    Medications Discontinued During This Encounter  Medication Reason   olmesartan (BENICAR) 40 MG tablet     Recommendations:   AZREAL STTHOMAS  is a 72 y.o. Asian [place] male with moderate coronary artery disease by angiogram on 01/23/2017,  stable angina pectoris, GERD, hypertension, hyperlipidemia controlled diabetes mellitus.  1. Coronary artery disease involving native coronary artery of native heart without angina pectoris Patient with moderate coronary disease, he is presently doing well and has had no episodes of chest pain.  He is able to do routine activities without any limitations, I encouraged him to exercise on a regular basis and walking for at least 30 minutes a day.  2. Essential hypertension Blood pressure under excellent control, he is only on carvedilol.  He has discontinued taking amlodipine and also Benicar 40 mg that he was previously on.  As his blood pressure is very well-controlled, in view of diabetes mellitus, advised him that he should be on an ARB, will prescribe 20 mg to take 10 mg daily.  3. Hypercholesteremia Lipids under excellent control.  I reviewed his external labs.  I discussed the findings of the CT scan of the abdomen that was done in August 2023 in which abdominal aorta showed no evidence of atherosclerosis and no evidence of AAA.  Overall stable from cardiac standpoint.  Patient is now living in [place], [place], encouraged him to establish with a primary care physician and a cardiologist there.  Patient plans to visit [place] sometime in March 2024, and would like to have in person visit and will set up an office visit with me.   Adrian Prows, MD, [facility] 11/08/2022, [place] PM Office: [phone]

## 2022-11-09 ENCOUNTER — Telehealth: Payer: Medicare Other | Admitting: Cardiology

## 2023-01-02 ENCOUNTER — Ambulatory Visit: Payer: Medicare Other | Admitting: Cardiology

## 2023-01-02 ENCOUNTER — Encounter: Payer: Self-pay | Admitting: Cardiology

## 2023-01-02 VITALS — BP 130/76 | HR 73 | Resp 16 | Ht 68.0 in | Wt 126.6 lb

## 2023-01-02 DIAGNOSIS — E1169 Type 2 diabetes mellitus with other specified complication: Secondary | ICD-10-CM | POA: Diagnosis not present

## 2023-01-02 DIAGNOSIS — R42 Dizziness and giddiness: Secondary | ICD-10-CM | POA: Diagnosis not present

## 2023-01-02 DIAGNOSIS — I2510 Atherosclerotic heart disease of native coronary artery without angina pectoris: Secondary | ICD-10-CM

## 2023-01-02 DIAGNOSIS — Z79899 Other long term (current) drug therapy: Secondary | ICD-10-CM | POA: Diagnosis not present

## 2023-01-02 DIAGNOSIS — I10 Essential (primary) hypertension: Secondary | ICD-10-CM | POA: Diagnosis not present

## 2023-01-02 DIAGNOSIS — E611 Iron deficiency: Secondary | ICD-10-CM | POA: Diagnosis not present

## 2023-01-02 DIAGNOSIS — E7800 Pure hypercholesterolemia, unspecified: Secondary | ICD-10-CM | POA: Diagnosis not present

## 2023-01-02 DIAGNOSIS — E11620 Type 2 diabetes mellitus with diabetic dermatitis: Secondary | ICD-10-CM | POA: Diagnosis not present

## 2023-01-02 DIAGNOSIS — E782 Mixed hyperlipidemia: Secondary | ICD-10-CM | POA: Diagnosis not present

## 2023-01-02 DIAGNOSIS — R5381 Other malaise: Secondary | ICD-10-CM | POA: Diagnosis not present

## 2023-01-02 NOTE — Progress Notes (Signed)
Primary Physician/Referring:  [name], MD  Patient ID: Aaron David, male    DOB: [birth year], 72 y.o.   MRN: [id number]  Chief Complaint  Patient presents with   Coronary Artery Disease   Hypertension   Follow-up    2 months   HPI:    Aaron David  is a 72 y.o. Asian [place] male with moderate coronary artery disease by angiogram on 01/23/2017,  stable angina pectoris, GERD, hypertension, hyperlipidemia controlled diabetes mellitus.  This is a 43-monthoffice visit.  He remains asymptomatic but would like to see me on a 6 monthly basis.  No new symptoms, no new complaints.  He saw his PCP Dr. SJonathon Jordanthis morning and had labs done as well.  Past Medical History:  Diagnosis Date   Acid reflux    Borderline diabetes    Coronary artery disease    Headache    Hyperlipidemia    Hypertension    Past Surgical History:  Procedure Laterality Date   LEFT HEART CATH AND CORONARY ANGIOGRAPHY N/A 01/23/2017   Procedure: Left Heart Cath and Coronary Angiography;  Surgeon: JAdrian Prows MD;  [facility];  Service: Cardiovascular;  Laterality: N/A;   Family History  Problem Relation Age of Onset   Diabetes Mother    Heart disease Mother    Asthma Father    Hypertension Sister    Heart disease Brother    Diabetes Sister    Diabetes Brother    Diabetes Brother    Heart disease Brother    Diabetes Brother    Diabetes Brother    Social History   Tobacco Use   Smoking status: Never   Smokeless tobacco: Never  Substance Use Topics   Alcohol use: No    Alcohol/week: 0.0 standard drinks of alcohol   Marital Status: Married  ROS  Review of Systems  Cardiovascular:  Negative for chest pain, dyspnea on exertion and leg swelling.  Gastrointestinal:  Negative for melena.   Objective      01/02/2023    4:24 PM 01/02/2023    3:14 PM 11/08/2022    4:10 PM  Vitals with BMI  Height  '5\' 8"'$  '5\' 8"'$   Weight  126 lbs 10 oz 130 lbs  BMI  [id number] Systolic [id number] Diastolic 76 75 76  Pulse  73 78   Physical Exam Neck:     Vascular: No carotid bruit or JVD.  Cardiovascular:     Rate and Rhythm: Normal rate and regular rhythm.     Pulses: Intact distal pulses.     Heart sounds: Normal heart sounds. No murmur heard.    No gallop.  Pulmonary:     Effort: Pulmonary effort is normal.     Breath sounds: Normal breath sounds.  Abdominal:     General: Bowel sounds are normal.     Palpations: Abdomen is soft.  Musculoskeletal:     Right lower leg: No edema.     Left lower leg: No edema.    Laboratory examination:   External Labs:   Labs 01/02/2023:  Serum glucose 170 mg, BUN 12, creatinine 0.86, EGFR 93 mL, potassium 4.8, LFTs normal.  Hb 13.7/HCT 41.6, platelets 140, normal indicis.  Total cholesterol 112, triglycerides 63, HDL 43, LDL 55.  Labs Labs 05/28/2022:  Total cholesterol 132, triglycerides 148, HDL 40, LDL 66.  A1c 6.7%.  TSH normal at 1.79.  Urine analysis normal.  Labs 02/06/2022:  TSH normal at 1.80.  A1c 7.0%.  Hb 13.8/HCT 40.4, platelets 138.  Normal indicis.  BUN 9, creatinine 0.80, EGFR 95 mL, LFTs normal.  02/15/2021: Total cholesterol 135, triglycerides 115, HDL 45, LDL 68  Allergies   Allergies  Allergen Reactions   Penicillins Rash    itching    Medications   Current Outpatient Medications:    albuterol (PROVENTIL HFA;VENTOLIN HFA) 108 (90 BASE) MCG/ACT inhaler, Inhale 2 puffs into the lungs every 6 (six) hours as needed for wheezing or shortness of breath., Disp: , Rfl:    amLODipine (NORVASC) 5 MG tablet, Take 1 tablet (5 mg total) by mouth daily., Disp: 100 tablet, Rfl: 3   aspirin 81 MG tablet, Take 81 mg by mouth daily., Disp: , Rfl:    atorvastatin (LIPITOR) 40 MG tablet, Take 1 tablet by mouth daily., Disp: , Rfl:    Azelastine-Fluticasone 137-50 MCG/ACT SUSP, Place 1 spray into the nose as needed., Disp: , Rfl:    carvedilol (COREG) 12.5 MG tablet, Take 1 tablet  (12.5 mg total) by mouth 2 (two) times daily., Disp: 200 tablet, Rfl: 3   Cyanocobalamin (VITAMIN B 12 PO), Take 1 tablet by mouth daily., Disp: , Rfl:    EPINEPHrine 0.3 mg/0.3 mL IJ SOAJ injection, Inject 0.3 mg into the muscle once as needed., Disp: , Rfl: 1   famotidine (PEPCID) 40 MG tablet, Take 40 mg by mouth 2 (two) times daily., Disp: , Rfl:    fluticasone (FLONASE) 50 MCG/ACT nasal spray, 1 spray by Each Nare route., Disp: , Rfl:    glucose blood (ONETOUCH VERIO) test strip, 1 strip., Disp: , Rfl:    metFORMIN (GLUCOPHAGE) 500 MG tablet, Take 250 mg by mouth 2 (two) times daily with a meal., Disp: , Rfl:    montelukast (SINGULAIR) 10 MG tablet, Take 10 mg by mouth at bedtime. , Disp: , Rfl:    Multiple Vitamin (MULTIVITAMIN) tablet, Take 1 tablet by mouth daily., Disp: , Rfl:    nitroGLYCERIN (NITROSTAT) 0.4 MG SL tablet, Place 1 tablet (0.4 mg total) under the tongue every 5 (five) minutes as needed for chest pain., Disp: 25 tablet, Rfl: 3   olmesartan (BENICAR) 20 MG tablet, Take 0.5 tablets (10 mg total) by mouth daily., Disp: 45 tablet, Rfl: 3   ONETOUCH VERIO test strip, 1 each daily., Disp: , Rfl:    VITAMIN D PO, Take 1,000 Units by mouth daily., Disp: , Rfl:    Ferrous Sulfate (IRON) 325 (65 Fe) MG TABS, Take 1 tablet by mouth daily. (Patient not taking: Reported on 01/02/2023), Disp: , Rfl:   Radiology    CXR 04/12/2021:  The heart, hila, and mediastinum are normal. There is a nodule in the left apex, unchanged since 2016, of no significance. No new nodules or masses. No focal infiltrates. No other acute abnormalities.  Cardiac Studies:   Nuclear stress test  [11/11/14]:  1. The resting electrocardiogram demonstrated normal sinus rhythm, normal resting conduction, no resting arrhythmias and normal rest repolarization. The stress electrocardiogram was at most equivocal for ischemia. There was 1 mm upsloping ST depression with exercise, back to baseline into recovery.  Stress test terminated due to fatigue. Normal blood pressure response. The patient completed an estimated workload of 7.4 METS. 2. Myocardial perfusion imaging is normal. Overall left ventricular systolic function was normal without regional wall motion abnormalities. The left ventricular ejection fraction was 52%.  Cardiac cath 01/23/2017: Proximal LAD 30%, ostial ramus 40%, mid RCA mild disease.  Normal LVEF.  EKG   EKG 01/02/2023: Normal sinus rhythm with rate of 71 bpm, normal EKG.  Compared to 05/10/2022, no significant change.  Assessment     ICD-10-CM   1. Coronary artery disease involving native coronary artery of native heart without angina pectoris  I25.10 EKG 12-Lead    2. Essential hypertension  I10     3. Hypercholesteremia  E78.00     4. Type 2 diabetes mellitus without complication, without long-term current use of insulin (HCC)  E11.9       No orders of the defined types were placed in this encounter.  Medications Discontinued During This Encounter  Medication Reason   triamcinolone cream (KENALOG) 0.1 %     Recommendations:   Aaron David  is a 72 y.o. [place] male with moderate coronary artery disease by angiogram on 01/23/2017,  stable angina pectoris, GERD, hypertension, hyperlipidemia controlled diabetes mellitus.  1. Coronary artery disease involving native coronary artery of native heart without angina pectoris Patient with moderate coronary disease, he is presently doing well and has had no episodes of chest pain.  He is able to do routine activities without any limitations, I encouraged him to exercise on a regular basis and walking for at least 30 minutes a day.  2. Essential hypertension Blood pressure under excellent control, presently on amlodipine, metoprolol and also olmesartan which he is tolerating.  I reviewed his external labs that were performed today, renal function is normal, diabetes is well-controlled, lipids under excellent control  as well.  No changes were done by me today.  3. Hypercholesteremia Lipids under excellent control.  I reviewed his external labs.    Overall stable from cardiac standpoint.  Patient is now living in [place], [place], encouraged him to establish with a primary care physician and a cardiologist there.  Patient plans to visit [employer] [facility]  and will set up an office visit with me on an annual basis if necessary.   Adrian Prows, MD, [facility] 01/02/2023, 4:25 PM Office: [phone]

## 2023-01-12 ENCOUNTER — Other Ambulatory Visit (HOSPITAL_BASED_OUTPATIENT_CLINIC_OR_DEPARTMENT_OTHER): Payer: Self-pay | Admitting: Family Medicine

## 2023-01-12 ENCOUNTER — Ambulatory Visit (HOSPITAL_BASED_OUTPATIENT_CLINIC_OR_DEPARTMENT_OTHER)
Admission: RE | Admit: 2023-01-12 | Discharge: 2023-01-12 | Disposition: A | Discharge status: Home / Self Care | Payer: Medicare Other | Source: Ambulatory Visit | Attending: Family Medicine | Admitting: Family Medicine

## 2023-01-12 DIAGNOSIS — R1032 Left lower quadrant pain: Secondary | ICD-10-CM | POA: Diagnosis not present

## 2023-01-12 DIAGNOSIS — M1612 Unilateral primary osteoarthritis, left hip: Secondary | ICD-10-CM | POA: Diagnosis not present

## 2023-01-12 DIAGNOSIS — R1030 Lower abdominal pain, unspecified: Secondary | ICD-10-CM | POA: Diagnosis not present

## 2023-03-14 ENCOUNTER — Encounter: Payer: Self-pay | Admitting: Cardiology

## 2023-03-20 ENCOUNTER — Encounter: Payer: Self-pay | Admitting: Cardiology

## 2023-03-20 NOTE — Telephone Encounter (Signed)
From patient.

## 2023-03-29 DIAGNOSIS — I10 Essential (primary) hypertension: Secondary | ICD-10-CM | POA: Diagnosis not present

## 2023-03-29 DIAGNOSIS — I9589 Other hypotension: Secondary | ICD-10-CM | POA: Diagnosis not present

## 2023-03-29 DIAGNOSIS — U071 COVID-19: Secondary | ICD-10-CM | POA: Diagnosis not present

## 2023-03-29 DIAGNOSIS — R509 Fever, unspecified: Secondary | ICD-10-CM | POA: Diagnosis not present

## 2023-05-31 ENCOUNTER — Other Ambulatory Visit: Payer: Self-pay | Admitting: Cardiology

## 2023-05-31 DIAGNOSIS — I25118 Atherosclerotic heart disease of native coronary artery with other forms of angina pectoris: Secondary | ICD-10-CM

## 2023-05-31 DIAGNOSIS — I10 Essential (primary) hypertension: Secondary | ICD-10-CM

## 2023-07-20 IMAGING — US US ABDOMEN LIMITED
1 series · 14 of 25 positions shown · non-contrast
Comparison: None Available.

CLINICAL DATA: Right upper quadrant abdominal pain.

EXAM:
ULTRASOUND ABDOMEN LIMITED RIGHT UPPER QUADRANT

[Series 1: us abdomen limited ruq (liver/gb) · 14 of 61 slices shown]
[im 1/61]
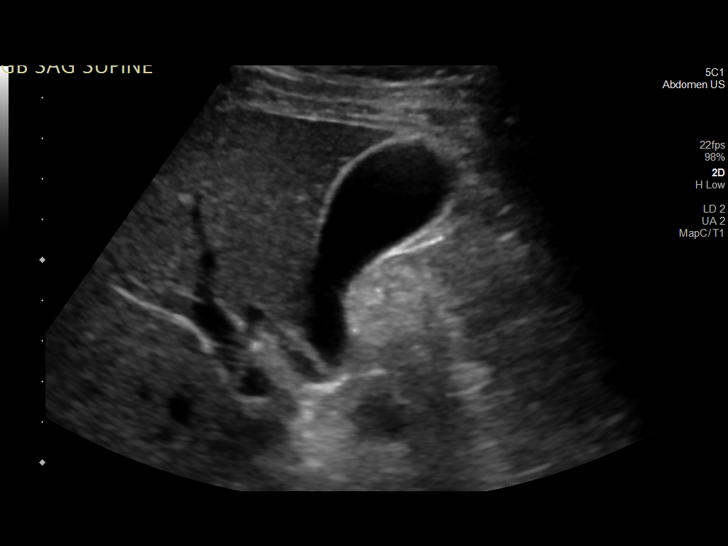
[im 6/61]
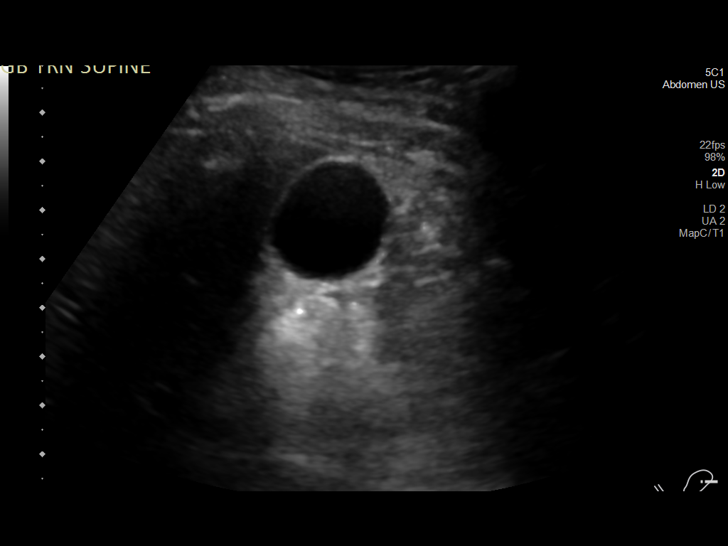
[im 11/61]
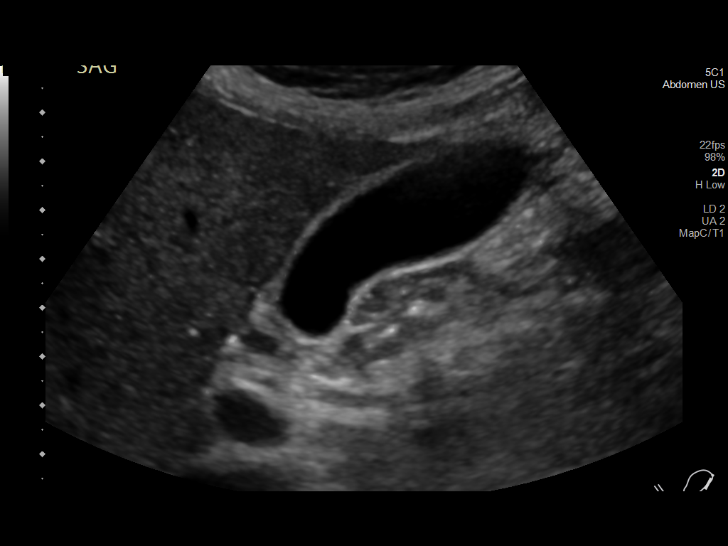
[im 16/61]
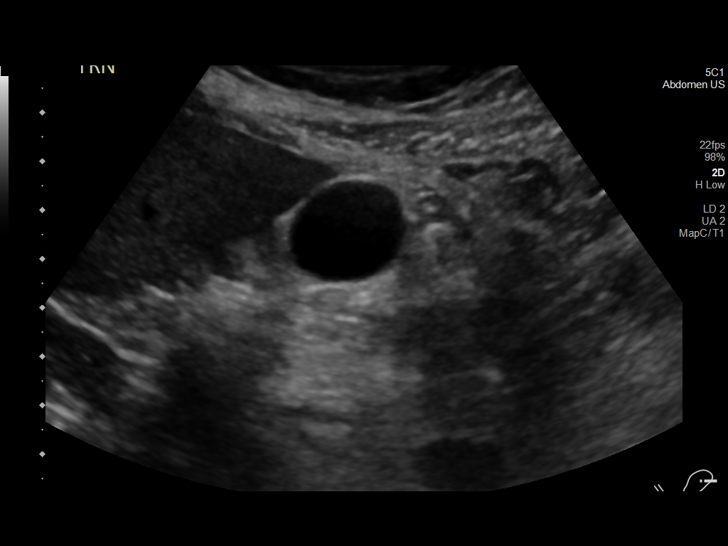
[im 21/61]
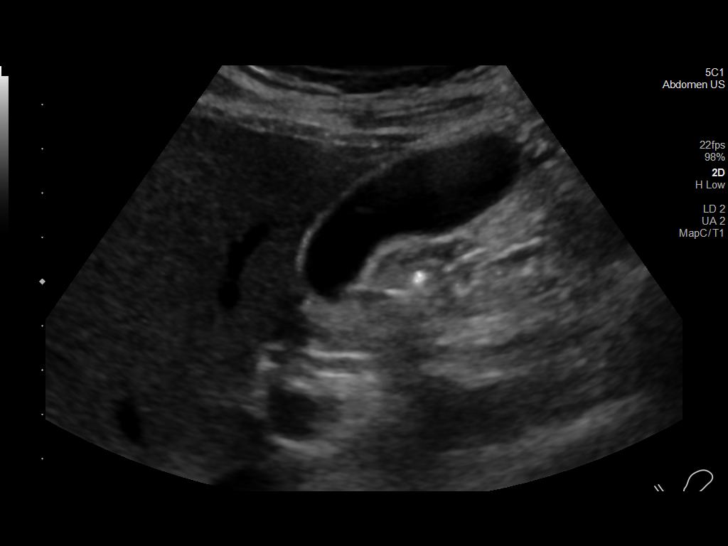
[im 23/61]
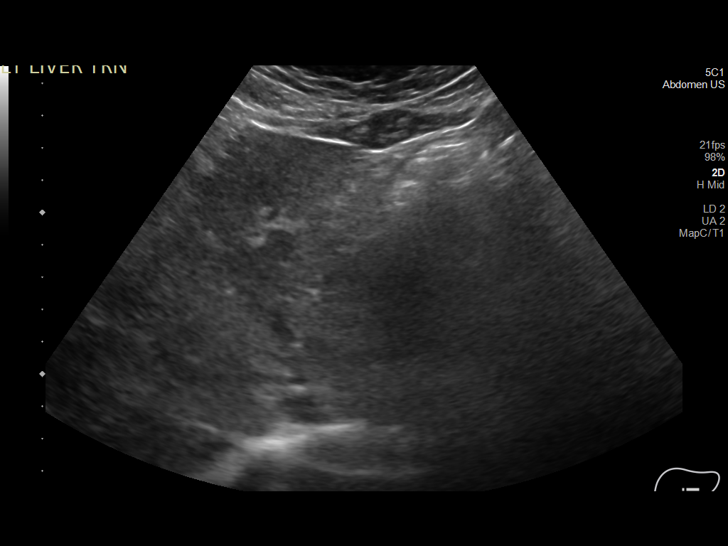
[im 28/61]
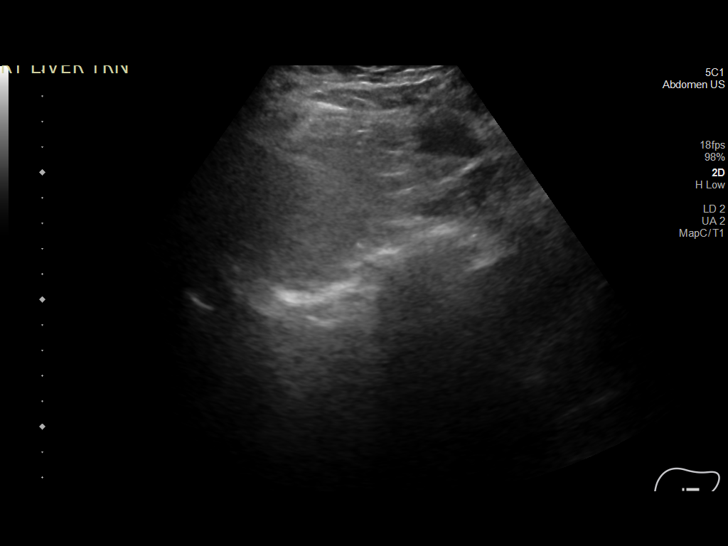
[im 33/61]
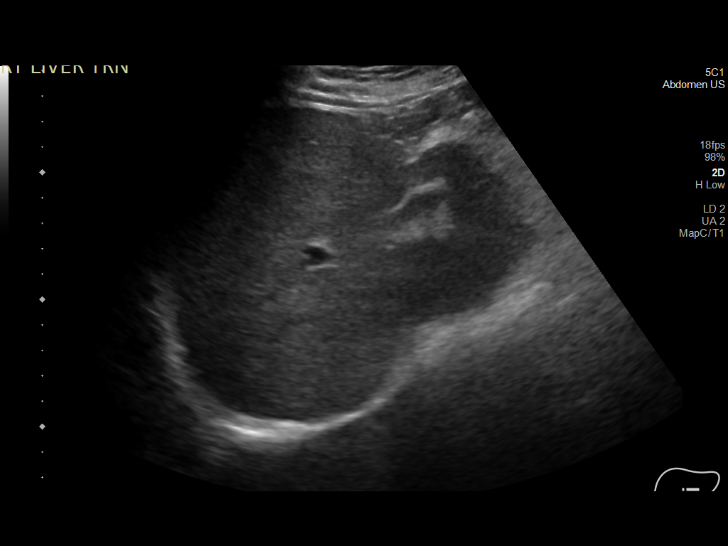
[im 38/61]
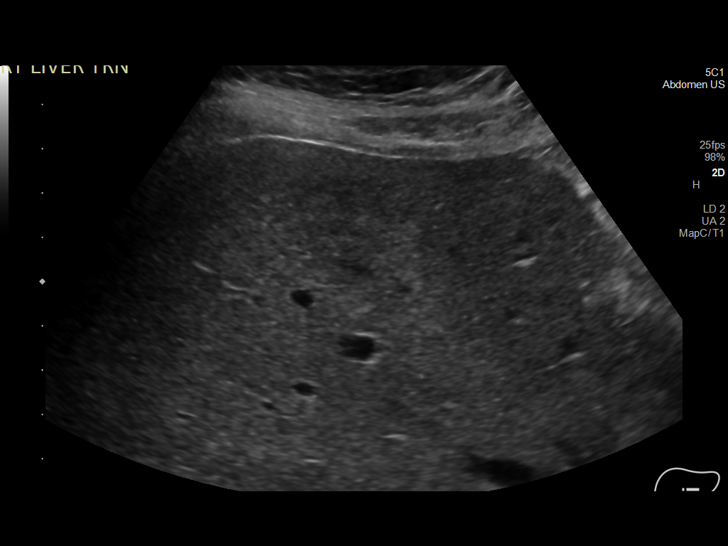
[im 41/61]
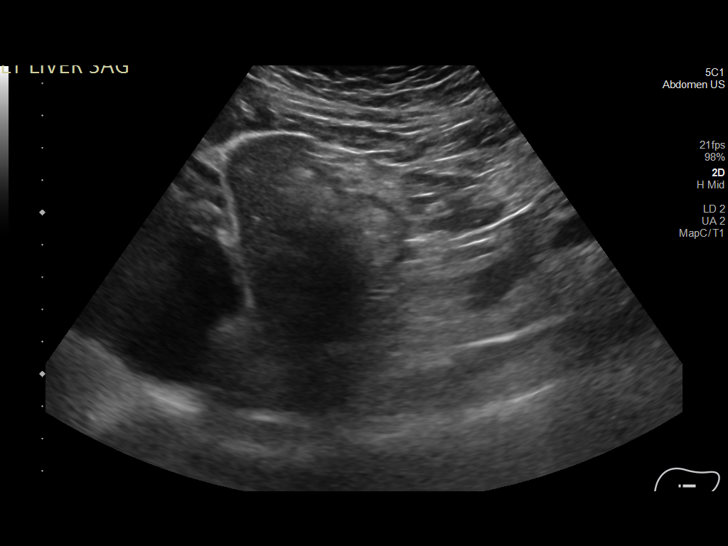
[im 46/61]
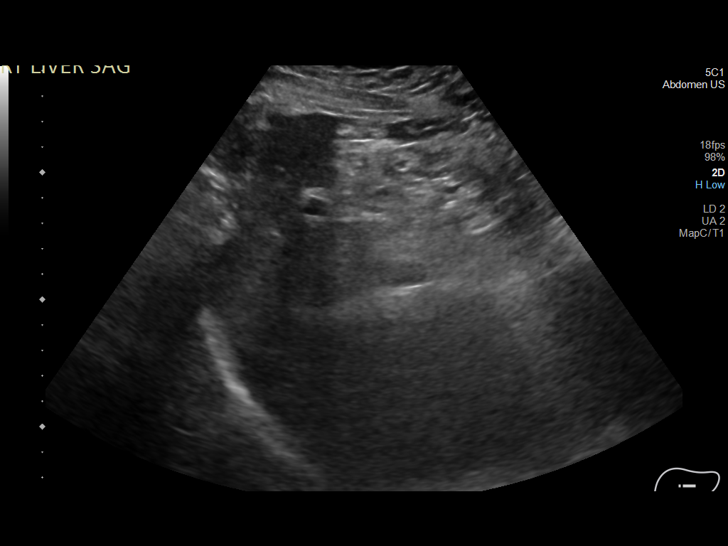
[im 51/61]
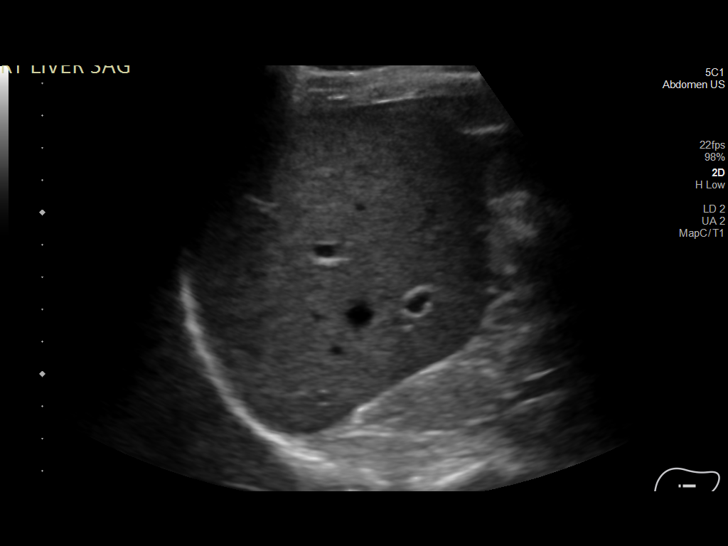
[im 56/61]
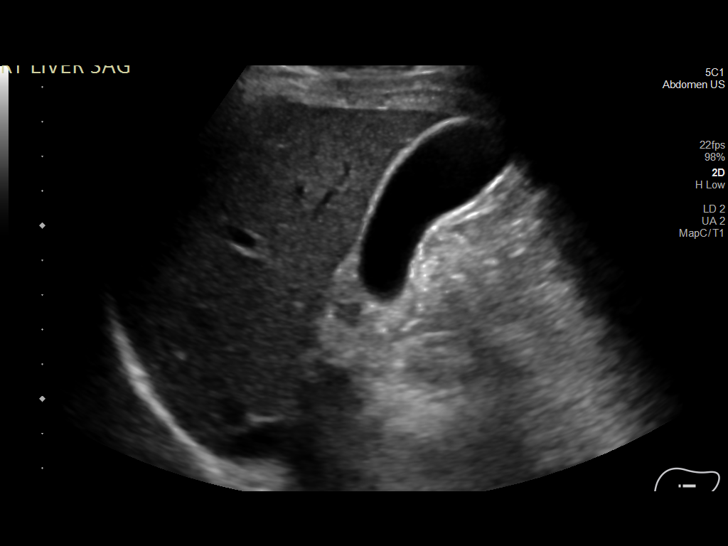
[im 61/61]
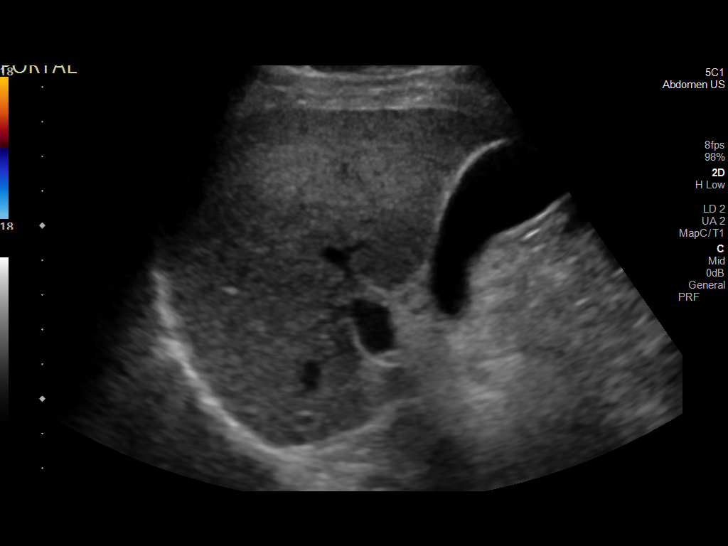

[14 of 25 positions shown; findings below may reference images not displayed]

FINDINGS: Gallbladder:

No gallstones or wall thickening visualized. No sonographic Murphy
sign noted by sonographer.

Common bile duct:

Diameter: 3 mm

Liver:

No focal lesion identified. Within normal limits in parenchymal
echogenicity. Portal vein is patent on color Doppler imaging with
normal direction of blood flow towards the liver.

Other: None.
IMPRESSION: Normal right upper quadrant ultrasound.

## 2023-07-20 IMAGING — NM NM HEPATO W/GB/PHARM/[PERSON_NAME]
2 series · 12 of 12 positions shown · non-contrast
Comparison: Ultrasound from earlier in the same day.

CLINICAL DATA: Right upper quadrant pain

EXAM:
NUCLEAR MEDICINE HEPATOBILIARY IMAGING WITH GALLBLADDER EF
TECHNIQUE: Sequential images of the abdomen were obtained [DATE] minutes
following intravenous administration of radiopharmaceutical. After
oral ingestion of Ensure, gallbladder ejection fraction was
determined. At 60 min, normal ejection fraction is greater than 33%.
RADIOPHARMACEUTICALS:  5.25 mCi Ic-WWm  Choletec IV

[Series 1: biliary · 4.14mm/px · 6 of 60 frames shown]
[frame 6/60]
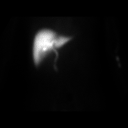
[frame 16/60]
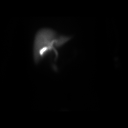
[frame 26/60]
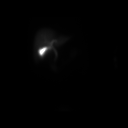
[frame 36/60]
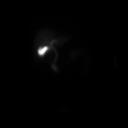
[frame 46/60]
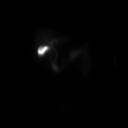
[frame 56/60]
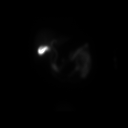

[Series 2: gbef · 4.14mm/px · 6 of 60 frames shown]
[frame 6/60]
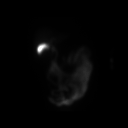
[frame 16/60]
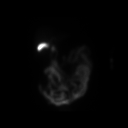
[frame 26/60]
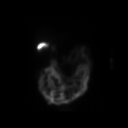
[frame 36/60]
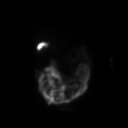
[frame 46/60]
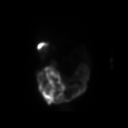
[frame 56/60]
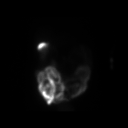

[12 of 12 positions shown; findings below may reference images not displayed]

FINDINGS: Prompt uptake and biliary excretion of activity by the liver is
seen. Gallbladder activity is visualized, consistent with patency of
cystic duct. Biliary activity passes into small bowel, consistent
with patent common bile duct.

Calculated gallbladder ejection fraction is 66%. (Normal gallbladder
ejection fraction with Ensure is greater than 33%.)
IMPRESSION: Normal uptake and excretion of biliary tracer.

Normal gallbladder ejection fraction.

## 2023-08-30 DIAGNOSIS — K219 Gastro-esophageal reflux disease without esophagitis: Secondary | ICD-10-CM | POA: Diagnosis not present

## 2023-08-30 DIAGNOSIS — J309 Allergic rhinitis, unspecified: Secondary | ICD-10-CM | POA: Diagnosis not present

## 2023-08-30 DIAGNOSIS — E119 Type 2 diabetes mellitus without complications: Secondary | ICD-10-CM | POA: Diagnosis not present

## 2023-08-30 DIAGNOSIS — Z299 Encounter for prophylactic measures, unspecified: Secondary | ICD-10-CM | POA: Diagnosis not present

## 2023-08-30 DIAGNOSIS — I10 Essential (primary) hypertension: Secondary | ICD-10-CM | POA: Diagnosis not present

## 2023-08-30 DIAGNOSIS — E7800 Pure hypercholesterolemia, unspecified: Secondary | ICD-10-CM | POA: Diagnosis not present

## 2023-09-10 DIAGNOSIS — Z23 Encounter for immunization: Secondary | ICD-10-CM | POA: Diagnosis not present

## 2023-09-10 DIAGNOSIS — Z7189 Other specified counseling: Secondary | ICD-10-CM | POA: Diagnosis not present

## 2023-09-10 DIAGNOSIS — I10 Essential (primary) hypertension: Secondary | ICD-10-CM | POA: Diagnosis not present

## 2023-09-10 DIAGNOSIS — R5383 Other fatigue: Secondary | ICD-10-CM | POA: Diagnosis not present

## 2023-09-10 DIAGNOSIS — Z0000 Encounter for general adult medical examination without abnormal findings: Secondary | ICD-10-CM | POA: Diagnosis not present

## 2023-09-10 DIAGNOSIS — E7800 Pure hypercholesterolemia, unspecified: Secondary | ICD-10-CM | POA: Diagnosis not present

## 2023-09-10 DIAGNOSIS — Z299 Encounter for prophylactic measures, unspecified: Secondary | ICD-10-CM | POA: Diagnosis not present

## 2023-09-10 DIAGNOSIS — Z79899 Other long term (current) drug therapy: Secondary | ICD-10-CM | POA: Diagnosis not present

## 2023-09-12 ENCOUNTER — Encounter (HOSPITAL_COMMUNITY): Payer: Self-pay

## 2023-09-12 ENCOUNTER — Ambulatory Visit: Payer: Medicare Other | Attending: Cardiology | Admitting: Cardiology

## 2023-09-12 ENCOUNTER — Encounter: Payer: Self-pay | Admitting: Cardiology

## 2023-09-12 ENCOUNTER — Encounter: Payer: Self-pay | Admitting: *Deleted

## 2023-09-12 VITALS — BP 138/70 | HR 81 | Resp 16 | Ht 68.0 in | Wt 124.0 lb

## 2023-09-12 DIAGNOSIS — E7800 Pure hypercholesterolemia, unspecified: Secondary | ICD-10-CM

## 2023-09-12 DIAGNOSIS — I25118 Atherosclerotic heart disease of native coronary artery with other forms of angina pectoris: Secondary | ICD-10-CM | POA: Diagnosis not present

## 2023-09-12 DIAGNOSIS — I10 Essential (primary) hypertension: Secondary | ICD-10-CM | POA: Diagnosis not present

## 2023-09-12 DIAGNOSIS — E119 Type 2 diabetes mellitus without complications: Secondary | ICD-10-CM

## 2023-09-12 NOTE — Progress Notes (Signed)
Cardiology Office Note:  .   Date:  09/12/2023  ID:  Aaron David, DOB [birth year], MRN [id number] PCP: Mila Palmer, MD  [facility] Providers Cardiologist:  Yates Decamp, MD   History of Present Illness: .   Aaron David is a 72 y.o. Asian [place] male with moderate coronary artery disease by angiogram on 01/23/2017, stable angina pectoris, GERD, hypertension, hyperlipidemia controlled diabetes mellitus.  He has been doing well until about few months ago started noticing occasional episodes of chest tightness and also palpitations associated with chest tightness with exertion.  This is new to him but has been concerned about this.  Otherwise denies dyspnea, no dizziness or syncope.  States that his diabetes is well-controlled.  He is accompanied by his wife.  Review of Systems  Cardiovascular:  Positive for dyspnea on exertion and palpitations. Negative for chest pain and leg swelling.    Labs   Lab Results  Component Value Date   CHOL 123 07/04/2019   HDL 43 07/04/2019   LDLCALC 65 07/04/2019   TRIG 72 07/04/2019   Lab Results  Component Value Date   NA 141 09/03/2020   K 4.3 09/03/2020   CO2 23 09/03/2020   GLUCOSE 87 09/03/2020   BUN 15 09/03/2020   CREATININE 0.92 09/03/2020   CALCIUM 8.8 09/03/2020   GFRNONAA 85 09/03/2020      Latest Ref Rng & Units 09/03/2020    2:46 PM 08/07/2019    1:51 PM 01/23/2017   12:10 PM  BMP  Glucose 65 - 99 mg/dL 87  865  784   BUN 8 - 27 mg/dL 15  13  7    Creatinine 0.76 - 1.27 mg/dL 6.96  2.95  2.84   BUN/Creat Ratio 10 - 24 16  15     Sodium 134 - 144 mmol/L 141  141  141   Potassium 3.5 - 5.2 mmol/L 4.3  4.9  4.0   Chloride 96 - 106 mmol/L 103  104  108   CO2 20 - 29 mmol/L 23  26  27    Calcium 8.6 - 10.2 mg/dL 8.8  9.2  8.5        Latest Ref Rng & Units 01/23/2017   12:10 PM  CBC  WBC 4.0 - 10.5 K/uL 7.8   Hemoglobin 13.0 - 17.0 g/dL 13.2   Hematocrit 44.0 - 52.0 % 41.4   Platelets 150 - 400 K/uL  141     External Labs:  Labs 01/02/2023:   Serum glucose 170 mg, BUN 12, creatinine 0.86, EGFR 93 mL, potassium 4.8, LFTs normal.   Hb 13.7/HCT 41.6, platelets 140, normal indicis.   Total cholesterol 112, triglycerides 63, HDL 43, LDL 55.  Physical Exam:   VS:  BP 138/70 (BP Location: Left Arm, Patient Position: Sitting, Cuff Size: Normal)   Pulse 81   Resp 16   Ht 5\' 8"  (1.727 m)   Wt 124 lb (56.2 kg)   SpO2 98%   BMI 18.85 kg/m    Wt Readings from Last 3 Encounters:  09/12/23 124 lb (56.2 kg)  01/02/23 126 lb 9.6 oz (57.4 kg)  11/08/22 130 lb (59 kg)     Physical Exam Neck:     Vascular: No carotid bruit or JVD.  Cardiovascular:     Rate and Rhythm: Normal rate and regular rhythm.     Pulses: Intact distal pulses.     Heart sounds: Normal heart sounds. No murmur heard.    No gallop.  Pulmonary:     Effort: Pulmonary effort is normal.     Breath sounds: Normal breath sounds.  Abdominal:     General: Bowel sounds are normal.     Palpations: Abdomen is soft.  Musculoskeletal:     Right lower leg: No edema.     Left lower leg: No edema.     Studies Reviewed: .    Cardiac cath 01/23/2017: Proximal LAD 30%, ostial ramus 40%, mid RCA mild disease. Normal LVEF.   EKG:   EKG 01/02/2023: Normal sinus rhythm with rate of 71 bpm, normal EKG.  Compared to 05/10/2022, no significant change.    Medications and allergies    Allergies  Allergen Reactions   Penicillins Rash    itching    Current Meds  Medication Sig   albuterol (PROVENTIL HFA;VENTOLIN HFA) 108 (90 BASE) MCG/ACT inhaler Inhale 2 puffs into the lungs every 6 (six) hours as needed for wheezing or shortness of breath.   amLODipine (NORVASC) 5 MG tablet Take 1 tablet (5 mg total) by mouth daily.   aspirin 81 MG tablet Take 81 mg by mouth daily.   atorvastatin (LIPITOR) 40 MG tablet Take 1 tablet by mouth daily.   Azelastine-Fluticasone 137-50 MCG/ACT SUSP Place 1 spray into the nose as needed.    carvedilol (COREG) 12.5 MG tablet TAKE ONE TABLET BY MOUTH TWICE DAILY   Cyanocobalamin (VITAMIN B 12 PO) Take 1 tablet by mouth daily.   EPINEPHrine 0.3 mg/0.3 mL IJ SOAJ injection Inject 0.3 mg into the muscle once as needed.   famotidine (PEPCID) 40 MG tablet Take 40 mg by mouth 2 (two) times daily.   Ferrous Sulfate (IRON) 325 (65 Fe) MG TABS Take 1 tablet by mouth daily.   fluticasone (FLONASE) 50 MCG/ACT nasal spray 1 spray by Each Nare route.   glucose blood (ONETOUCH VERIO) test strip 1 strip.   metFORMIN (GLUCOPHAGE) 500 MG tablet Take 250 mg by mouth 2 (two) times daily with a meal.   montelukast (SINGULAIR) 10 MG tablet Take 10 mg by mouth at bedtime.    Multiple Vitamin (MULTIVITAMIN) tablet Take 1 tablet by mouth daily.   olmesartan (BENICAR) 20 MG tablet Take 0.5 tablets (10 mg total) by mouth daily.   ONETOUCH VERIO test strip 1 each daily.   VITAMIN D PO Take 1,000 Units by mouth daily.     ASSESSMENT AND PLAN: .      ICD-10-CM   1. Coronary artery disease of native artery of native heart with stable angina pectoris (HCC)  I25.118 ECHOCARDIOGRAM COMPLETE    MYOCARDIAL PERFUSION IMAGING    Cardiac Stress Test: Informed Consent Details: Physician/Practitioner Attestation; Transcribe to consent form and obtain patient signature    2. Essential hypertension  I10     3. Hypercholesteremia  E78.00     4. Type 2 diabetes mellitus without complication, without long-term current use of insulin (HCC)  E11.9       Assessment and Plan 1. Coronary artery disease of native artery of native heart with stable angina pectoris [facility]) Patient with moderate disease in his coronary arteries especially in the LAD by catheterization in 2018, he has not had any further testing since then.  He now presents with new onset exertional chest pain suggestive of angina pectoris.  His risk factors are well-controlled including hypertension, hyperlipidemia and diabetes mellitus.  In view of this I will  proceed with obtaining exercise nuclear stress test and also obtain an echocardiogram to establish a baseline.  -  ECHOCARDIOGRAM COMPLETE; Future - MYOCARDIAL PERFUSION IMAGING; Future - Cardiac Stress Test: Informed Consent Details: Physician/Practitioner Attestation; Transcribe to consent form and obtain patient signature  2. Essential hypertension Blood pressure Controlled with amlodipine and olmesartan, continue the same.  3. Hypercholesteremia Reviewed his lipids, LDL is at goal, presently on Lipitor 40 mg daily.  4. Type 2 diabetes mellitus without complication, without long-term current use of insulin (HCC) Diabetes is well-controlled on metformin.  If significant coronary disease with abnormal stress test is confirmed, we will consider changing to either SGLT2 inhibitor versus GLP-1 agonist with the help of his PCP.  Advised him to continue to be fairly active like he is presently unless his chest pain gets worse.  Unless the stress is abnormal I will see him back in 6 months or if symptoms worsen.   Informed Consent   Shared Decision Making/Informed Consent The risks [chest pain, shortness of breath, cardiac arrhythmias, dizziness, blood pressure fluctuations, myocardial infarction, stroke/transient ischemic attack, nausea, vomiting, allergic reaction, radiation exposure, metallic taste sensation and life-threatening complications (estimated to be 1 in 10,000)], benefits (risk stratification, diagnosing coronary artery disease, treatment guidance) and alternatives of a nuclear stress test were discussed in detail with Mr. Punzalan and he agrees to proceed.      Signed,  Yates Decamp, MD, [facility] 09/12/2023, 1:04 PM [facility] [address] [place], [place] [address] Phone: [phone]. Fax:  [phone]

## 2023-09-12 NOTE — Patient Instructions (Signed)
Medication Instructions:  Your physician recommends that you continue on your current medications as directed. Please refer to the Current Medication list given to you today.  *If you need a refill on your cardiac medications before your next appointment, please call your pharmacy*   Lab Work: none If you have labs (blood work) drawn today and your tests are completely normal, you will receive your results only by: MyChart Message (if you have MyChart) OR A paper copy in the mail If you have any lab test that is abnormal or we need to change your treatment, we will call you to review the results.   Testing/Procedures: Your physician has requested that you have an exercise stress myoview. For further information please visit [email or web address] Please follow instruction sheet, as given.    Your physician has requested that you have an echocardiogram. Echocardiography is a painless test that uses sound waves to create images of your heart. It provides your doctor with information about the size and shape of your heart and how well your heart's chambers and valves are working. This procedure takes approximately one hour. There are no restrictions for this procedure. Please do NOT wear cologne, perfume, aftershave, or lotions (deodorant is allowed). Please arrive 15 minutes prior to your appointment time.  Please note: We ask at that you not bring children with you during ultrasound (echo/ vascular) testing. Due to room size and safety concerns, children are not allowed in the ultrasound rooms during exams. Our front office staff cannot provide observation of children in our lobby area while testing is being conducted. An adult accompanying a patient to their appointment will only be allowed in the ultrasound room at the discretion of the ultrasound technician under special circumstances. We apologize for any inconvenience.   Follow-Up: At [facility], you and your health needs are  our priority.  As part of our continuing mission to provide you with exceptional heart care, we have created designated Provider Care Teams.  These Care Teams include your primary Cardiologist (physician) and Advanced Practice Providers (APPs -  Physician Assistants and Nurse Practitioners) who all work together to provide you with the care you need, when you need it.  We recommend signing up for the patient portal called "MyChart".  Sign up information is provided on this After Visit Summary.  MyChart is used to connect with patients for Virtual Visits (Telemedicine).  Patients are able to view lab/test results, encounter notes, upcoming appointments, etc.  Non-urgent messages can be sent to your provider as well.   To learn more about what you can do with MyChart, go to [email or web address].    Your next appointment:   6 month(s)  Provider:   Yates Decamp, MD     Other Instructions

## 2023-09-13 ENCOUNTER — Ambulatory Visit (HOSPITAL_COMMUNITY): Payer: Medicare Other | Attending: Cardiology

## 2023-09-13 DIAGNOSIS — I25118 Atherosclerotic heart disease of native coronary artery with other forms of angina pectoris: Secondary | ICD-10-CM | POA: Diagnosis not present

## 2023-09-13 LAB — ECHOCARDIOGRAM COMPLETE
Area-P 1/2: 3.62 cm2
S' Lateral: 2.6 cm

## 2023-09-13 NOTE — Progress Notes (Signed)
Essentially normal echo

## 2023-09-14 ENCOUNTER — Ambulatory Visit (HOSPITAL_COMMUNITY): Payer: Medicare Other | Attending: Cardiology

## 2023-09-14 DIAGNOSIS — I25118 Atherosclerotic heart disease of native coronary artery with other forms of angina pectoris: Secondary | ICD-10-CM | POA: Diagnosis not present

## 2023-09-14 LAB — MYOCARDIAL PERFUSION IMAGING
Angina Index: 0
Duke Treadmill Score: 7
Estimated workload: 8.1
Exercise duration (min): 6 min
Exercise duration (sec): 46 s
LV dias vol: 54 mL (ref 62–150)
LV sys vol: 22 mL
MPHR: 149 {beats}/min
Nuc Stress EF: 58 %
Peak HR: 153 {beats}/min
Percent HR: 102 %
Rest HR: 80 {beats}/min
Rest Nuclear Isotope Dose: 10.6 mCi
SDS: 0
SRS: 0
SSS: 0
ST Depression (mm): 0 mm
Stress Nuclear Isotope Dose: 31.5 mCi
TID: 0.89

## 2023-09-14 MED ORDER — TECHNETIUM TC 99M TETROFOSMIN IV KIT
10.6000 | PACK | Freq: Once | INTRAVENOUS | Status: AC | PRN
Start: 2023-09-14 — End: 2023-09-14
  Administered 2023-09-14: 10.6 via INTRAVENOUS

## 2023-09-14 MED ORDER — TECHNETIUM TC 99M TETROFOSMIN IV KIT
31.5000 | PACK | Freq: Once | INTRAVENOUS | Status: AC | PRN
  Administered 2023-09-14: 31.5 via INTRAVENOUS

## 2023-09-14 NOTE — Progress Notes (Signed)
Normal stress test. Good effort,  BP went up high with exercise, but otherwise all good  Exercise nuclear stress test 09/14/23  Patient exercised for 6 minutes and achieved 8.1 METS.  [facility] treadmill score 7.0.  Hypertensive blood pressure response, peak blood pressure 214/85 mmHg. No ST-T wave changes of ischemia.  No significant arrhythmias.   LV perfusion is normal. There is no evidence of ischemia. There is no evidence of infarction. Apical thinning artifact.   Left ventricular function is normal. Nuclear stress EF: 58%. The left ventricular ejection fraction is normal (55-65%). End diastolic cavity size is normal.   Average exercise capacity. Nornal HR/BP response to exercise.   The study is normal. The study is low risk.

## 2023-12-11 DIAGNOSIS — R142 Eructation: Secondary | ICD-10-CM | POA: Diagnosis not present

## 2023-12-11 DIAGNOSIS — H18413 Arcus senilis, bilateral: Secondary | ICD-10-CM | POA: Diagnosis not present

## 2023-12-11 DIAGNOSIS — H25043 Posterior subcapsular polar age-related cataract, bilateral: Secondary | ICD-10-CM | POA: Diagnosis not present

## 2023-12-11 DIAGNOSIS — H25013 Cortical age-related cataract, bilateral: Secondary | ICD-10-CM | POA: Diagnosis not present

## 2023-12-11 DIAGNOSIS — K5730 Diverticulosis of large intestine without perforation or abscess without bleeding: Secondary | ICD-10-CM | POA: Diagnosis not present

## 2023-12-11 DIAGNOSIS — R194 Change in bowel habit: Secondary | ICD-10-CM | POA: Diagnosis not present

## 2023-12-11 DIAGNOSIS — K219 Gastro-esophageal reflux disease without esophagitis: Secondary | ICD-10-CM | POA: Diagnosis not present

## 2023-12-11 DIAGNOSIS — H2512 Age-related nuclear cataract, left eye: Secondary | ICD-10-CM | POA: Diagnosis not present

## 2023-12-11 DIAGNOSIS — H2513 Age-related nuclear cataract, bilateral: Secondary | ICD-10-CM | POA: Diagnosis not present

## 2023-12-18 DIAGNOSIS — R0600 Dyspnea, unspecified: Secondary | ICD-10-CM | POA: Diagnosis not present

## 2023-12-18 DIAGNOSIS — Z299 Encounter for prophylactic measures, unspecified: Secondary | ICD-10-CM | POA: Diagnosis not present

## 2023-12-18 DIAGNOSIS — I10 Essential (primary) hypertension: Secondary | ICD-10-CM | POA: Diagnosis not present

## 2023-12-18 DIAGNOSIS — E119 Type 2 diabetes mellitus without complications: Secondary | ICD-10-CM | POA: Diagnosis not present

## 2023-12-18 DIAGNOSIS — M169 Osteoarthritis of hip, unspecified: Secondary | ICD-10-CM | POA: Diagnosis not present

## 2023-12-26 ENCOUNTER — Ambulatory Visit
Admission: RE | Admit: 2023-12-26 | Discharge: 2023-12-26 | Disposition: A | Discharge status: Home / Self Care | Source: Ambulatory Visit | Attending: Internal Medicine | Admitting: Internal Medicine

## 2023-12-26 ENCOUNTER — Other Ambulatory Visit: Payer: Self-pay | Admitting: Internal Medicine

## 2023-12-26 DIAGNOSIS — R0602 Shortness of breath: Secondary | ICD-10-CM

## 2023-12-26 DIAGNOSIS — R079 Chest pain, unspecified: Secondary | ICD-10-CM | POA: Diagnosis not present

## 2023-12-26 DIAGNOSIS — R059 Cough, unspecified: Secondary | ICD-10-CM | POA: Diagnosis not present

## 2024-02-08 DIAGNOSIS — H2512 Age-related nuclear cataract, left eye: Secondary | ICD-10-CM | POA: Diagnosis not present

## 2024-02-08 DIAGNOSIS — H2511 Age-related nuclear cataract, right eye: Secondary | ICD-10-CM | POA: Diagnosis not present

## 2024-02-18 DIAGNOSIS — Z299 Encounter for prophylactic measures, unspecified: Secondary | ICD-10-CM | POA: Diagnosis not present

## 2024-02-18 DIAGNOSIS — Z7189 Other specified counseling: Secondary | ICD-10-CM | POA: Diagnosis not present

## 2024-02-18 DIAGNOSIS — Z1389 Encounter for screening for other disorder: Secondary | ICD-10-CM | POA: Diagnosis not present

## 2024-02-18 DIAGNOSIS — I10 Essential (primary) hypertension: Secondary | ICD-10-CM | POA: Diagnosis not present

## 2024-02-18 DIAGNOSIS — Z0000 Encounter for general adult medical examination without abnormal findings: Secondary | ICD-10-CM | POA: Diagnosis not present

## 2024-02-22 DIAGNOSIS — H25041 Posterior subcapsular polar age-related cataract, right eye: Secondary | ICD-10-CM | POA: Diagnosis not present

## 2024-02-22 DIAGNOSIS — H25011 Cortical age-related cataract, right eye: Secondary | ICD-10-CM | POA: Diagnosis not present

## 2024-02-22 DIAGNOSIS — H2511 Age-related nuclear cataract, right eye: Secondary | ICD-10-CM | POA: Diagnosis not present

## 2024-03-10 DIAGNOSIS — I10 Essential (primary) hypertension: Secondary | ICD-10-CM | POA: Diagnosis not present

## 2024-03-10 DIAGNOSIS — M542 Cervicalgia: Secondary | ICD-10-CM | POA: Diagnosis not present

## 2024-03-10 DIAGNOSIS — R42 Dizziness and giddiness: Secondary | ICD-10-CM | POA: Diagnosis not present

## 2024-03-25 DIAGNOSIS — H9319 Tinnitus, unspecified ear: Secondary | ICD-10-CM | POA: Diagnosis not present

## 2024-03-25 DIAGNOSIS — E1165 Type 2 diabetes mellitus with hyperglycemia: Secondary | ICD-10-CM | POA: Diagnosis not present

## 2024-03-25 DIAGNOSIS — Z299 Encounter for prophylactic measures, unspecified: Secondary | ICD-10-CM | POA: Diagnosis not present

## 2024-03-25 DIAGNOSIS — I10 Essential (primary) hypertension: Secondary | ICD-10-CM | POA: Diagnosis not present

## 2024-04-15 DIAGNOSIS — H35372 Puckering of macula, left eye: Secondary | ICD-10-CM | POA: Diagnosis not present

## 2024-04-17 ENCOUNTER — Ambulatory Visit: Attending: Cardiology | Admitting: Cardiology

## 2024-04-17 ENCOUNTER — Encounter: Payer: Self-pay | Admitting: Cardiology

## 2024-04-17 VITALS — BP 128/73 | HR 81 | Resp 16 | Ht 68.0 in | Wt 126.0 lb

## 2024-04-17 DIAGNOSIS — E119 Type 2 diabetes mellitus without complications: Secondary | ICD-10-CM | POA: Diagnosis not present

## 2024-04-17 DIAGNOSIS — I2510 Atherosclerotic heart disease of native coronary artery without angina pectoris: Secondary | ICD-10-CM | POA: Diagnosis not present

## 2024-04-17 DIAGNOSIS — I10 Essential (primary) hypertension: Secondary | ICD-10-CM

## 2024-04-17 DIAGNOSIS — E7800 Pure hypercholesterolemia, unspecified: Secondary | ICD-10-CM

## 2024-04-17 MED ORDER — NITROGLYCERIN 0.4 MG SL SUBL: 0.4000 mg | SUBLINGUAL_TABLET | SUBLINGUAL | 3 refills | Status: DC | PRN

## 2024-04-17 MED ORDER — OLMESARTAN MEDOXOMIL 20 MG PO TABS: 10.0000 mg | ORAL_TABLET | Freq: Every day | ORAL | Status: DC

## 2024-04-17 NOTE — Progress Notes (Signed)
 Cardiology Office Note:  .   Date:  04/17/2024  ID:  Aaron David, DOB [birth year], MRN [id number] PCP: Aaron Leta NOVAK, MD  [facility] Providers Cardiologist:  Aaron Bergamo, MD   History of Present Illness: .   Aaron David is a 73 y.o. Asian [place] male with moderate coronary artery disease by angiogram on 01/23/2017, stable angina pectoris, GERD, hypertension, hyperlipidemia controlled diabetes mellitus. On his last office visit a year ago he had complained of dyspnea and also chest pain for which he underwent repeat stress testing and echocardiogram on 09/14/2023 revealing apical thinning but no evidence of ischemia and EF 55% by stress test overall low risk and echocardiogram revealing normal LVEF at 60 to 65% without significant valvular abnormality.   Discussed the use of AI scribe software for clinical note transcription with the patient, who gave verbal consent to proceed.  History of Present Illness Aaron David is a 73 year old male with hypertension and diabetes who presents for a cardiovascular follow-up.  Home readings of BP consistently in the 120s to 130s range. He experiences mild leg swelling when on his feet extensively. His diabetes is well-controlled with an A1c between 6.7 and 6.8. He is not currently taking amlodipine  or olmesartan  which he was previously on.  He continues to remain active and remains asymptomatic but likes to see me on a yearly basis.  Labs   External Labs:  Care everywhere labs 09/10/2023:  Hb 14.3/HCT 44.1, platelets 160.  Serum glucose 138 mg, BUN 8, creatinine 0.86, EGFR 93 mL, potassium 4.3, LFTs normal.  TSH normal at 2.62.  Total cholesterol 120, triglycerides 53, HDL 46, LDL 62.  ROS  Review of Systems  Cardiovascular:  Negative for chest pain, dyspnea on exertion and leg swelling.    Physical Exam:   VS:  BP 128/73 (BP Location: Left Arm, Patient Position: Sitting, Cuff Size: Normal)    Pulse 81   Resp 16   Ht 5' 8 (1.727 m)   Wt 126 lb (57.2 kg)   SpO2 95%   BMI 19.16 kg/m    Wt Readings from Last 3 Encounters:  04/17/24 126 lb (57.2 kg)  09/14/23 124 lb (56.2 kg)  09/12/23 124 lb (56.2 kg)    Physical Exam Neck:     Vascular: No carotid bruit or JVD.   Cardiovascular:     Rate and Rhythm: Normal rate and regular rhythm.     Pulses: Intact distal pulses.     Heart sounds: Normal heart sounds. No murmur heard.    No gallop.  Pulmonary:     Effort: Pulmonary effort is normal.     Breath sounds: Normal breath sounds.  Abdominal:     General: Bowel sounds are normal.     Palpations: Abdomen is soft.   Musculoskeletal:     Right lower leg: No edema.     Left lower leg: No edema.    Studies Reviewed: .    Cardiac cath 01/23/2017: Proximal LAD 30%, ostial ramus 40%, mid RCA mild disease. Normal LVEF.   MYOCARDIAL PERFUSION IMAGING 09/14/2023   Narrative   LV perfusion is normal. There is no evidence of ischemia. There is no evidence of infarction. Apical thinning artifact.   Left ventricular function is normal. Nuclear stress EF: 58%. The left ventricular ejection fraction is normal (55-65%). End diastolic cavity size is normal.   Average exercise capacity. Nornal HR/BP response to exercise.   The study is normal. The  study is low risk.  ECHOCARDIOGRAM COMPLETE 09/13/2023  1. Left ventricular ejection fraction, by estimation, is 60 to 65%. The left ventricle has normal function. The left ventricle has no regional wall motion abnormalities. Left ventricular diastolic parameters were normal. GLS -17.5%. 2. Right ventricular systolic function is normal. The right ventricular size is normal. 3. The mitral valve is normal in structure. Trivial mitral valve regurgitation. No evidence of mitral stenosis. 4. The aortic valve is normal in structure. Aortic valve regurgitation is not visualized. No aortic stenosis is present. 5. The inferior vena cava is normal in  size with greater than 50% respiratory variability, suggesting right atrial pressure of 3 mmHg.   EKG:    EKG Interpretation Date/Time:  Thursday April 17 2024 14:25:02 EDT Ventricular Rate:  84 PR Interval:  144 QRS Duration:  70 QT Interval:  378 QTC Calculation: 446 R Axis:   62  Text Interpretation: EKG 04/17/2024: Normal sinus rhythm with rate of 84 bpm, normal EKG. When compared with ECG of 23-Jan-2017 11:38, No significant change was found Confirmed by Aaron David, Aaron David ([id number]) on 04/17/2024 2:45:18 PM    Medications ordered    Meds ordered this encounter  Medications   olmesartan  (BENICAR ) 20 MG tablet    Sig: Take 0.5 tablets (10 mg total) by mouth daily.   nitroGLYCERIN  (NITROSTAT ) 0.4 MG SL tablet    Sig: Place 1 tablet (0.4 mg total) under the tongue every 5 (five) minutes as needed for chest pain.    Dispense:  25 tablet    Refill:  3     ASSESSMENT AND PLAN: .      ICD-10-CM   1. Essential hypertension  I10 olmesartan  (BENICAR ) 20 MG tablet    2. Hypercholesteremia  E78.00     3. Type 2 diabetes mellitus without complication, without long-term current use of insulin (HCC)  E11.9 olmesartan  (BENICAR ) 20 MG tablet    4. Coronary artery disease involving native coronary artery of native heart without angina pectoris  I25.10 EKG 12-Lead    nitroGLYCERIN  (NITROSTAT ) 0.4 MG SL tablet     Assessment & Plan CAD Moderate disease in the  LAD Remains asymptomatic - Cont ASA 81 mg daily  Hypertension Hypertension is well-controlled with home blood pressure readings consistently in the 120s to 130s. Current medication regimen includes Caladryl 2.5 mg twice a day. Amlodipine  has been discontinued. - Restart omisartan 10 mg once daily to provide renal and ocular protection, especially in the context of diabetes. Omisartan is expected to have minimal side effects at this dose, unlike the previous higher dose which could cause dizziness.  Type 2 Diabetes Mellitus Type 2  Diabetes Mellitus is managed with an A1c of 6.8. - Restart omisartan 10 mg once daily for renal protection.  Hypercholesterolemia External labs reviewed, lipids under excellent control on atorvastatin  40 mg daily.  Cataract Surgery Post-cataract surgery recovery is uneventful with no new issues reported.  The patient is stable from cardiac standpoint however patient would like to see me on a yearly basis.   Signed,  Aaron Bergamo, MD, [facility] 04/17/2024, 5:49 PM [facility] [address] [place], [place] [address] Phone: [phone]. Fax:  [phone]

## 2024-04-17 NOTE — Patient Instructions (Addendum)
 Medication Instructions:  Your physician has recommended you make the following change in your medication:  Resume Olmesartan  10 mg by mouth daily (half of a 20 mg tablet) *If you need a refill on your cardiac medications before your next appointment, please call your pharmacy*  Lab Work: none If you have labs (blood work) drawn today and your tests are completely normal, you will receive your results only by: MyChart Message (if you have MyChart) OR A paper copy in the mail If you have any lab test that is abnormal or we need to change your treatment, we will call you to review the results.  Testing/Procedures: none  Follow-Up: At [facility], you and your health needs are our priority.  As part of our continuing mission to provide you with exceptional heart care, our providers are all part of one team.  This team includes your primary Cardiologist (physician) and Advanced Practice Providers or APPs (Physician Assistants and Nurse Practitioners) who all work together to provide you with the care you need, when you need it.  Your next appointment:   12 month(s)  Provider:   Gordy Bergamo, MD    We recommend signing up for the patient portal called MyChart.  Sign up information is provided on this After Visit Summary.  MyChart is used to connect with patients for Virtual Visits (Telemedicine).  Patients are able to view lab/test results, encounter notes, upcoming appointments, etc.  Non-urgent messages can be sent to your provider as well.   To learn more about what you can do with MyChart, go to [email or web address].   Other Instructions

## 2024-05-20 DIAGNOSIS — H18413 Arcus senilis, bilateral: Secondary | ICD-10-CM | POA: Diagnosis not present

## 2024-05-20 DIAGNOSIS — Z961 Presence of intraocular lens: Secondary | ICD-10-CM | POA: Diagnosis not present

## 2024-05-20 DIAGNOSIS — H26493 Other secondary cataract, bilateral: Secondary | ICD-10-CM | POA: Diagnosis not present

## 2024-05-20 DIAGNOSIS — H04123 Dry eye syndrome of bilateral lacrimal glands: Secondary | ICD-10-CM | POA: Diagnosis not present

## 2024-05-28 DIAGNOSIS — H04123 Dry eye syndrome of bilateral lacrimal glands: Secondary | ICD-10-CM | POA: Diagnosis not present

## 2024-06-03 DIAGNOSIS — H26492 Other secondary cataract, left eye: Secondary | ICD-10-CM | POA: Diagnosis not present

## 2024-07-16 DIAGNOSIS — Z299 Encounter for prophylactic measures, unspecified: Secondary | ICD-10-CM | POA: Diagnosis not present

## 2024-07-16 DIAGNOSIS — Z79899 Other long term (current) drug therapy: Secondary | ICD-10-CM | POA: Diagnosis not present

## 2024-07-16 DIAGNOSIS — R5383 Other fatigue: Secondary | ICD-10-CM | POA: Diagnosis not present

## 2024-07-16 DIAGNOSIS — I10 Essential (primary) hypertension: Secondary | ICD-10-CM | POA: Diagnosis not present

## 2024-07-16 DIAGNOSIS — J069 Acute upper respiratory infection, unspecified: Secondary | ICD-10-CM | POA: Diagnosis not present

## 2024-07-16 DIAGNOSIS — E119 Type 2 diabetes mellitus without complications: Secondary | ICD-10-CM | POA: Diagnosis not present

## 2024-08-02 ENCOUNTER — Other Ambulatory Visit: Payer: Self-pay | Admitting: Cardiology

## 2024-08-02 DIAGNOSIS — I10 Essential (primary) hypertension: Secondary | ICD-10-CM

## 2024-08-02 DIAGNOSIS — I25118 Atherosclerotic heart disease of native coronary artery with other forms of angina pectoris: Secondary | ICD-10-CM

## 2024-08-21 ENCOUNTER — Other Ambulatory Visit: Payer: Self-pay | Admitting: Cardiology

## 2024-08-21 DIAGNOSIS — E119 Type 2 diabetes mellitus without complications: Secondary | ICD-10-CM

## 2024-08-21 DIAGNOSIS — I10 Essential (primary) hypertension: Secondary | ICD-10-CM

## 2024-10-20 ENCOUNTER — Other Ambulatory Visit: Payer: Self-pay

## 2024-10-20 DIAGNOSIS — I2510 Atherosclerotic heart disease of native coronary artery without angina pectoris: Secondary | ICD-10-CM

## 2024-10-21 ENCOUNTER — Telehealth: Payer: Self-pay | Admitting: Cardiology

## 2024-10-21 DIAGNOSIS — I10 Essential (primary) hypertension: Secondary | ICD-10-CM

## 2024-10-21 MED ORDER — NITROGLYCERIN 0.4 MG SL SUBL: 0.4000 mg | SUBLINGUAL_TABLET | SUBLINGUAL | 1 refills | Status: AC | PRN

## 2024-10-21 MED ORDER — AMLODIPINE BESYLATE 5 MG PO TABS: 5.0000 mg | ORAL_TABLET | Freq: Every day | ORAL | 3 refills | Status: AC

## 2024-10-21 NOTE — Telephone Encounter (Signed)
ICD-10-CM   1. Essential hypertension  I10 amLODipine (NORVASC) 5 MG tablet     Meds ordered this encounter  Medications   amLODipine (NORVASC) 5 MG tablet    Sig: Take 1 tablet (5 mg total) by mouth daily.    Dispense:  90 tablet    Refill:  3

## 2024-10-21 NOTE — Addendum Note (Signed)
 Addended by: LADONA MILAN on: 10/21/2024 04:03 PM   Modules accepted: Orders

## 2024-10-21 NOTE — Telephone Encounter (Signed)
 Pt wants to start Amlodipine  again. Please advise.
